# Patient Record
Sex: Male | Born: 1985 | Hispanic: Yes | Marital: Single | Smoking: Never smoker
Health system: Southern US, Community
[De-identification: ages and names within clinical notes are randomized; demographics above are authoritative.]

## PROBLEM LIST (undated history)

## (undated) DIAGNOSIS — N200 Calculus of kidney: Secondary | ICD-10-CM

## (undated) HISTORY — DX: Calculus of kidney: N20.0

## (undated) HISTORY — PX: OTHER SURGICAL HISTORY: SHX169

---

## 2014-02-16 ENCOUNTER — Encounter (HOSPITAL_COMMUNITY): Payer: Self-pay | Admitting: Emergency Medicine

## 2014-02-16 ENCOUNTER — Emergency Department (HOSPITAL_COMMUNITY)
Admission: EM | Admit: 2014-02-16 | Discharge: 2014-02-16 | Disposition: A | Discharge status: Home / Self Care | Payer: Self-pay | Attending: Emergency Medicine | Admitting: Emergency Medicine

## 2014-02-16 DIAGNOSIS — Z87448 Personal history of other diseases of urinary system: Secondary | ICD-10-CM | POA: Insufficient documentation

## 2014-02-16 DIAGNOSIS — B349 Viral infection, unspecified: Secondary | ICD-10-CM

## 2014-02-16 DIAGNOSIS — B9789 Other viral agents as the cause of diseases classified elsewhere: Secondary | ICD-10-CM | POA: Insufficient documentation

## 2014-02-16 DIAGNOSIS — R21 Rash and other nonspecific skin eruption: Secondary | ICD-10-CM | POA: Insufficient documentation

## 2014-02-16 LAB — URINALYSIS, ROUTINE W REFLEX MICROSCOPIC
BILIRUBIN URINE: NEGATIVE
Glucose, UA: NEGATIVE mg/dL
HGB URINE DIPSTICK: NEGATIVE
Ketones, ur: NEGATIVE mg/dL
Leukocytes, UA: NEGATIVE
NITRITE: NEGATIVE
Protein, ur: NEGATIVE mg/dL
Specific Gravity, Urine: 1.02 (ref 1.005–1.030)
UROBILINOGEN UA: 1 mg/dL (ref 0.0–1.0)
pH: 5.5 (ref 5.0–8.0)

## 2014-02-16 NOTE — ED Provider Notes (Signed)
CSN: [id number]     Arrival date & time 02/16/14  1032 History   First MD Initiated Contact with Patient 02/16/14 1054     Chief Complaint  Patient presents with  . Allergic Reaction     (Consider location/radiation/quality/duration/timing/severity/associated sxs/prior Treatment) Patient is a 28 y.o. male presenting with fever. The history is provided by the patient and the spouse. No language interpreter was used.  Fever Max temp prior to arrival:  103 Temp source:  Oral Duration:  2 days Associated symptoms: myalgias and rash   Associated symptoms: no chest pain, no congestion, no cough, no diarrhea, no dysuria, no nausea and no vomiting   Associated symptoms comment:  Yesterday he started having fever to Tmax 103, generalized body aches without other symptoms. No cough, N, V, dysuria or congestion. No sick family members. He took ibuprofen last night and experienced hives that resolved spontaneously. No SOB, oral swelling. Per his wife, who is acting as interpreter, he had a history of similar symptoms in the past associated with a kidney infection. He denies urinary symptoms currently.    No past medical history on file. History reviewed. No pertinent past surgical history. No family history on file. History  Substance Use Topics  . Smoking status: Never Smoker   . Smokeless tobacco: Not on file  . Alcohol Use: No    Review of Systems  Constitutional: Positive for fever.  HENT: Negative for congestion and trouble swallowing.   Respiratory: Negative for cough and shortness of breath.   Cardiovascular: Negative for chest pain.  Gastrointestinal: Negative for nausea, vomiting, abdominal pain and diarrhea.  Genitourinary: Negative for dysuria and hematuria.  Musculoskeletal: Positive for myalgias.  Skin: Positive for rash.      Allergies  Review of patient's allergies indicates no known allergies.  Home Medications   Current Outpatient Rx  Name  Route  Sig  Dispense   Refill  . ibuprofen (ADVIL,MOTRIN) 200 MG tablet   Oral   Take 400 mg by mouth every 6 (six) hours as needed for moderate pain.          BP 130/77  Pulse 79  Temp(Src) 98.3 F (36.8 C) (Oral)  Resp 16  SpO2 100% Physical Exam  Constitutional: He appears well-developed and well-nourished.  HENT:  Head: Normocephalic.  Neck: Normal range of motion. Neck supple.  Cardiovascular: Normal rate and regular rhythm.   Pulmonary/Chest: Effort normal and breath sounds normal.  Abdominal: Soft. Bowel sounds are normal. There is no tenderness. There is no rebound and no guarding.  Musculoskeletal: Normal range of motion.  Neurological: He is alert. No cranial nerve deficit.  Skin: Skin is warm and dry. No rash noted.  Psychiatric: He has a normal mood and affect.    ED Course  Procedures (including critical care time) Labs Review Labs Reviewed  URINALYSIS, ROUTINE W REFLEX MICROSCOPIC   Imaging Review No results found.   EKG Interpretation None      MDM   Final diagnoses:  None    1. Febrile illness 2. Hives, resolved (thought secondary to ibuprofen)  He is well appearing without evidence of focal infection. Suspect viral process and will treat symptomatically. Instructed not to take ibuprofen in the future.     Arnoldo HookerShari A Ajai Harville, PA-C 02/16/14 1216

## 2014-02-16 NOTE — Discharge Instructions (Signed)
Cundo no se Cendant Corporationutilizan los antibiticos (Antibiotic Nonuse)  El mdico considera que la infeccin o problema que se ha presentado no puede solucionarse con antibiticos. La causa puede ser un virus o una bacteria. Slo el mdico podr determinar cul es la causa probable de la enfermedad. El resfro es la causa ms frecuente de infecciones tanto en adultos como en nios. La causa del resfro es un virus. El tratamiento con antibiticos no tendr Avon Productsefecto en una infeccin viral. Los virus son los responsables de la prdida de Rite Aidmuchos das de trabajo en la atencin de los nios enfermos, y tambin la prdida de [address]muchos das de clases. Los nios pueden contraer hasta 10 resfros o gripes por ao durante los cuales pueden presentar lagrimeo, sentirse molestos o incmodos. El objetivo del tratamiento en el caso de los virus es mantener confortable al enfermo. Los antibiticos son medicamentos que se utilizan para ayudar al organismo a Production managerluchar contra las infecciones bacterianas. Existen relativamente pocos tipos de bacterias que causan infecciones, pero hay cientos de virus. Aunque ambos ocasionan infecciones, son tipos de Holiday representativegrmenes muy diferentes. Una infeccin viral desaparecer por s Caremark Rxmisma dentro de los 7 a [address] das. Las infecciones bacterianas pueden contagiarse o empeorar si no se administra un tratamiento con antibiticos. Ejemplos de infecciones bacterianas son:  Anginas (como en las faringitis estreptoccicas o la amigdalitis).  Infecciones en el pulmn (neumona).  Infecciones en el odo y la piel. Ejemplos de infecciones virales son:  Resfros o gripe  La mayora de los casos de tos y bronquitis.  Anginas que no son causadas por el estreptococo.  Secrecin nasal. Lo mejor es no administrar antibiticos cuando una infeccin viral es la causa del problema. Los antibiticos pueden destruir las bacterias que son buenas para el organismo y se encuentran dentro del mismo y pueden hacer que las bacterias  dainas se desarrollen. Los antibiticos pueden tener efectos indeseables como La Croftalergias, nuseas y diarrea y no mejoran los sntomas de las infecciones virales. Adems, el uso repetido de antibiticos puede hacer que las bacterias que se encuentran dentro del organismo se vuelvan resistentes. Esa resistencia puede transmitirse a las bacterias dainas. La prxima vez que sufra una infeccin puede ser difcil tratarla si han utilizado antibiticos cuando no era necesario. Cuando no se utilizan antibiticos, el sistema inmunolgico se fortalece y combate las infecciones ms eficientemente. Tambin los antibiticos tendrn un mayor efecto cuando se prescriben en las infecciones bacterianas. En el caso de los nios, los tratamientos incluyen:  La administracin de lquidos extra Cardinal Healthdurante el da para hidratarlo.  Debe hacer reposo.  Slo adminstrele medicamentos de venta libre o los que le prescriba su mdico para Engineer, materialsaliviar el dolor, el malestar o la fiebre, segn las indicaciones.  El uso de un humidificador fro puede ser de utilidad cuando hay secrecin nasal.  Medicamentos para el resfro segn las indicaciones del mdico. El profesional que lo asiste podr prescribirle antibiticos si:  El problema que presenta hoy contina durante un tiempo mayor del esperado.  Sufre una infeccin bacteriana secundaria. SOLICITE ATENCIN MDICA SI:  La fiebre dura ms de 5 das.  Los sntomas no mejoran luego de 5 a [address] das, o Houstonempeoran.  Tiene dificultad para respirar.  Tiene sntomas de deshidratacin (bebe poco, no orina con frecuencia, la orina es de color oscuro).  Observa cambios en la conducta o siente ms cansancio (apata o letargo). Document Released: 12/03/2005 Document Revised: 02/25/2012 [facility] Patient Information 2014 [place] Estate[place], [place].  IT IS IMPORTANT NOT TO USE IBUPROFEN AGAIN  IN THE FUTURE. TAKE TYLENOL ONLY. Ronchas  (Hives)  Las ronchas son reas de la piel inflamadas (hinchadas)  rojas y que pican. Pueden cambiar de tamao y de ubicacin en el cuerpo. Las Armed forces operational officer y Geneticist, molecular durante algunas horas o das (ronchas agudas) o durante algunas semanas (ronchas crnicas). No pueden transmitirse de [place] persona a Theodoro Clock (no son contagiosas). Pueden empeorar al rascarse, hacer ejercicios y por estrs emocional.  CAUSAS   Reaccin alrgica a alimentos, aditivos o frmacos.  Infecciones, incluso el resfro comn.  Enfermedades, como la vasculitis, el lupus o la enfermedad tiroidea.  Exposicin al sol, al calor o al fro.  La prctica de ejercicios.  El estrs.  El contacto con algunas sustancias qumicas. SNTOMAS   Zonas hinchadas, rojas o blancas, sobre la piel. Las ronchas pueden cambiar de [place], forma, [place] y Armed forces logistics/support/administrative officer.  Picazn.  Hinchazn de las [employer] y [place]. Esto puede ocurrir si las ronchas se desarrollan en capas profundas de la piel. DIAGNSTICO  El mdico puede diagnosticar el problema haciendo un examen fsico. Conley Rolls indicar anlisis de sangre o un estudio de la piel para Production assistant, radio causa. En algunos casos, no puede determinarse la causa.  TRATAMIENTO  Los casos leves generalmente mejoran con medicamentos como los antihistamnicos. Los casos ms graves pueden requerir una inyeccin de epinefrina de Associate Professor. Si se conoce la causa de la urticaria, el tratamiento incluye evitar el factor desencadenante.  INSTRUCCIONES PARA EL CUIDADO EN EL HOGAR   Evite las causas que han desencadenado las ronchas.  Tome los antihistamnicos segn las indicaciones del mdico para reducir la gravedad de las ronchas. Generalmente se recomiendan los [employer] no son sedantes o con bajo efecto sedante. No conduzca vehculos mientras toma antihistamnicos.  Tome los medicamentos para la picazn exactamente como le indic el mdico.  Use ropas sueltas.  Cumpla con todas las visitas de control, segn le  indique su mdico. SOLICITE ATENCIN MDICA SI:   Siente una picazn intensa o persistente que no se calma con los medicamentos.  Le duelen las articulaciones o estn inflamadas. SOLICITE ATENCIN MDICA DE INMEDIATO SI:   Tiene fiebre.  Tiene la boca o los labios hinchados.  Tiene problemas para respirar o tragar.  Siente una opresin en la garganta o en el pecho.  Siente dolor abdominal. Estos problemas pueden ser los primeros signos de una reaccin alrgica que ponga en peligro la vida. Llame a los servicios de emergencia locales (911 en los [place]). ASEGRESE DE QUE:   Comprende estas instrucciones.  Controlar su enfermedad.  Solicitar ayuda de inmediato si no mejora o si empeora. Document Released: 12/03/2005 Document Revised: 06/03/2012 [facility] Patient Information 2014 [place], [place].

## 2014-02-16 NOTE — ED Notes (Signed)
Pt c/o lip swelling and rash since yesterday.  Denies any allergies that he knows about.  Airway patent.  Denies SOB.  Pt's wife states that pt had a fever of 103 yesterday.  States he has had a cold for about a week but has not started any new soaps or foods.

## 2014-02-16 NOTE — ED Provider Notes (Signed)
Medical screening examination/treatment/procedure(s) were performed by non-physician practitioner and as supervising physician I was immediately available for consultation/collaboration.   Korrey Schleicher T Goro Wenrick, MD 02/16/14 1542 

## 2020-01-29 ENCOUNTER — Emergency Department (HOSPITAL_COMMUNITY): Payer: Self-pay

## 2020-01-29 ENCOUNTER — Emergency Department (HOSPITAL_COMMUNITY)
Admission: EM | Admit: 2020-01-29 | Discharge: 2020-01-29 | Disposition: A | Discharge status: Home / Self Care | Payer: Self-pay | Attending: Emergency Medicine | Admitting: Emergency Medicine

## 2020-01-29 ENCOUNTER — Other Ambulatory Visit: Payer: Self-pay

## 2020-01-29 ENCOUNTER — Encounter (HOSPITAL_COMMUNITY): Payer: Self-pay | Admitting: Emergency Medicine

## 2020-01-29 DIAGNOSIS — N130 Hydronephrosis with ureteropelvic junction obstruction: Secondary | ICD-10-CM | POA: Insufficient documentation

## 2020-01-29 DIAGNOSIS — N23 Unspecified renal colic: Secondary | ICD-10-CM

## 2020-01-29 LAB — URINALYSIS, ROUTINE W REFLEX MICROSCOPIC
Bacteria, UA: NONE SEEN
Bilirubin Urine: NEGATIVE
Glucose, UA: NEGATIVE mg/dL
Ketones, ur: NEGATIVE mg/dL
Leukocytes,Ua: NEGATIVE
Nitrite: NEGATIVE
Protein, ur: NEGATIVE mg/dL
RBC / HPF: >50 RBC/hpf — ABNORMAL HIGH (ref 0–5)
Specific Gravity, Urine: 1.026 (ref 1.005–1.030)
pH: 6 (ref 5.0–8.0)

## 2020-01-29 LAB — BASIC METABOLIC PANEL
Anion gap: 10 (ref 5–15)
BUN: 10 mg/dL (ref 6–20)
CO2: 23 mmol/L (ref 22–32)
Calcium: 9.1 mg/dL (ref 8.9–10.3)
Chloride: 104 mmol/L (ref 98–111)
Creatinine, Ser: 0.89 mg/dL (ref 0.61–1.24)
GFR calc Af Amer: >60 mL/min (ref >60)
GFR calc non Af Amer: >60 mL/min (ref >60)
Glucose, Bld: 126 mg/dL — ABNORMAL HIGH (ref 70–99)
Potassium: 3.4 mmol/L — ABNORMAL LOW (ref 3.5–5.1)
Sodium: 137 mmol/L (ref 135–145)

## 2020-01-29 LAB — CBC WITH DIFFERENTIAL/PLATELET
Abs Immature Granulocytes: 0.03 10*3/uL (ref 0.00–0.07)
Basophils Absolute: 0.1 10*3/uL (ref 0.0–0.1)
Basophils Relative: 1 %
Eosinophils Absolute: 0.2 10*3/uL (ref 0.0–0.5)
Eosinophils Relative: 3 %
HCT: 46.5 % (ref 39.0–52.0)
Hemoglobin: 15 g/dL (ref 13.0–17.0)
Immature Granulocytes: 0 %
Lymphocytes Relative: 50 %
Lymphs Abs: 4.6 10*3/uL — ABNORMAL HIGH (ref 0.7–4.0)
MCH: 28.1 pg (ref 26.0–34.0)
MCHC: 32.3 g/dL (ref 30.0–36.0)
MCV: 87.1 fL (ref 80.0–100.0)
Monocytes Absolute: 1 10*3/uL (ref 0.1–1.0)
Monocytes Relative: 11 %
Neutro Abs: 3.2 10*3/uL (ref 1.7–7.7)
Neutrophils Relative %: 35 %
Platelets: 202 10*3/uL (ref 150–400)
RBC: 5.34 MIL/uL (ref 4.22–5.81)
RDW: 12.9 % (ref 11.5–15.5)
WBC: 9.1 10*3/uL (ref 4.0–10.5)
nRBC: 0 % (ref 0.0–0.2)

## 2020-01-29 MED ORDER — TAMSULOSIN HCL 0.4 MG PO CAPS: 0.4000 mg | ORAL_CAPSULE | Freq: Every day | ORAL | 0 refills | Status: DC

## 2020-01-29 MED ORDER — ONDANSETRON 8 MG PO TBDP: ORAL_TABLET | ORAL | 0 refills | Status: DC

## 2020-01-29 MED ORDER — FENTANYL CITRATE (PF) 100 MCG/2ML IJ SOLN
100.0000 ug | Freq: Once | INTRAMUSCULAR | Status: AC
  Administered 2020-01-29: 06:00:00 100 ug via INTRAVENOUS
  Filled 2020-01-29: qty 2

## 2020-01-29 MED ORDER — TAMSULOSIN HCL 0.4 MG PO CAPS
0.4000 mg | ORAL_CAPSULE | ORAL | Status: AC
  Administered 2020-01-29: 05:00:00 0.4 mg via ORAL
  Filled 2020-01-29: qty 1

## 2020-01-29 MED ORDER — FENTANYL CITRATE (PF) 100 MCG/2ML IJ SOLN
50.0000 ug | Freq: Once | INTRAMUSCULAR | Status: AC
  Administered 2020-01-29: 50 ug via INTRAVENOUS
  Filled 2020-01-29: qty 2

## 2020-01-29 MED ORDER — DICLOFENAC SODIUM ER 100 MG PO TB24: 100.0000 mg | ORAL_TABLET | Freq: Every day | ORAL | 0 refills | Status: DC

## 2020-01-29 MED ORDER — ONDANSETRON HCL 4 MG/2ML IJ SOLN
4.0000 mg | Freq: Once | INTRAMUSCULAR | Status: AC
  Administered 2020-01-29: 05:00:00 4 mg via INTRAVENOUS
  Filled 2020-01-29: qty 2

## 2020-01-29 MED ORDER — OXYCODONE-ACETAMINOPHEN 5-325 MG PO TABS: 1.0000 | ORAL_TABLET | Freq: Four times a day (QID) | ORAL | 0 refills | Status: DC | PRN

## 2020-01-29 MED ORDER — KETOROLAC TROMETHAMINE 15 MG/ML IJ SOLN
15.0000 mg | Freq: Once | INTRAMUSCULAR | Status: AC
  Administered 2020-01-29: 05:00:00 15 mg via INTRAVENOUS
  Filled 2020-01-29: qty 1

## 2020-01-29 NOTE — ED Triage Notes (Signed)
Patient reports left flank pain this evening , denies dysuria or hematuria , no injury or fall , no fever or chills .

## 2020-01-29 NOTE — ED Notes (Signed)
Patient transported to CT. Other medications to be given and IV initiated when the patient returns.

## 2020-01-29 NOTE — ED Provider Notes (Signed)
[facility] EMERGENCY DEPARTMENT Provider Note   CSN: [id number] Arrival date & time: 01/29/20  5784     History Chief Complaint  Patient presents with  . Flank Pain    Reginald Velasquez is a 34 y.o. male.  The history is provided by the patient.  Flank Pain This is a recurrent problem. The current episode started 1 to 2 hours ago (left flank). The problem occurs constantly. The problem has not changed since onset.Pertinent negatives include no chest pain, no headaches and no shortness of breath. Nothing aggravates the symptoms. Nothing relieves the symptoms. He has tried nothing for the symptoms. The treatment provided no relief.  Patient with h/o stones presents with same pain this evening and associated nausea.       History reviewed. No pertinent past medical history.  There are no problems to display for this patient.   History reviewed. No pertinent surgical history.     History reviewed. No pertinent family history.  Social History   Tobacco Use  . Smoking status: Never Smoker  . Smokeless tobacco: Never Used  Substance Use Topics  . Alcohol use: No  . Drug use: No    Home Medications Prior to Admission medications   Not on File    Allergies    Patient has no known allergies.  Review of Systems   Review of Systems  Constitutional: Negative for fever.  HENT: Negative for congestion.   Eyes: Negative for visual disturbance.  Respiratory: Negative for shortness of breath.   Cardiovascular: Negative for chest pain.  Gastrointestinal: Positive for nausea. Negative for vomiting.  Genitourinary: Positive for flank pain.  Musculoskeletal: Negative for arthralgias.  Skin: Negative for rash.  Neurological: Negative for headaches.  Psychiatric/Behavioral: Negative for agitation.  All other systems reviewed and are negative.   Physical Exam Updated Vital Signs BP (!) 146/88   Pulse 63   Temp 98.5 F (36.9 C) (Oral)   Resp 18    SpO2 98%   Physical Exam Vitals and nursing note reviewed.  Constitutional:      Appearance: He is normal weight. He is not diaphoretic.  HENT:     Head: Normocephalic and atraumatic.     Nose: Nose normal.  Eyes:     Conjunctiva/sclera: Conjunctivae normal.     Pupils: Pupils are equal, round, and reactive to light.  Cardiovascular:     Rate and Rhythm: Normal rate and regular rhythm.     Pulses: Normal pulses.     Heart sounds: Normal heart sounds.  Pulmonary:     Effort: Pulmonary effort is normal.     Breath sounds: Normal breath sounds.  Abdominal:     General: Abdomen is flat.     Tenderness: There is no abdominal tenderness. There is no guarding or rebound.  Musculoskeletal:        General: Normal range of motion.     Cervical back: Normal range of motion and neck supple.  Skin:    General: Skin is warm and dry.     Capillary Refill: Capillary refill takes less than 2 seconds.  Neurological:     General: No focal deficit present.     Mental Status: He is alert and oriented to person, place, and time.     Deep Tendon Reflexes: Reflexes normal.  Psychiatric:        Mood and Affect: Mood normal.        Behavior: Behavior normal.     ED Results / Procedures /  Treatments   Labs (all labs ordered are listed, but only abnormal results are displayed) Results for orders placed or performed during the hospital encounter of 01/29/20  Urinalysis, Routine w reflex microscopic  Result Value Ref Range   Color, Urine YELLOW YELLOW   APPearance HAZY (A) CLEAR   Specific Gravity, Urine 1.026 1.005 - 1.030   pH 6.0 5.0 - 8.0   Glucose, UA NEGATIVE NEGATIVE mg/dL   Hgb urine dipstick MODERATE (A) NEGATIVE   Bilirubin Urine NEGATIVE NEGATIVE   Ketones, ur NEGATIVE NEGATIVE mg/dL   Protein, ur NEGATIVE NEGATIVE mg/dL   Nitrite NEGATIVE NEGATIVE   Leukocytes,Ua NEGATIVE NEGATIVE   RBC / HPF >50 (H) 0 - 5 RBC/hpf   WBC, UA 0-5 0 - 5 WBC/hpf   Bacteria, UA NONE SEEN NONE SEEN    Squamous Epithelial / LPF 0-5 0 - 5   Mucus PRESENT   CBC with Differential  Result Value Ref Range   WBC 9.1 4.0 - 10.5 K/uL   RBC 5.34 4.22 - 5.81 MIL/uL   Hemoglobin 15.0 13.0 - 17.0 g/dL   HCT 46.5 39.0 - 52.0 %   MCV 87.1 80.0 - 100.0 fL   MCH 28.1 26.0 - 34.0 pg   MCHC 32.3 30.0 - 36.0 g/dL   RDW 12.9 11.5 - 15.5 %   Platelets 202 150 - 400 K/uL   nRBC 0.0 0.0 - 0.2 %   Neutrophils Relative % 35 %   Neutro Abs 3.2 1.7 - 7.7 K/uL   Lymphocytes Relative 50 %   Lymphs Abs 4.6 (H) 0.7 - 4.0 K/uL   Monocytes Relative 11 %   Monocytes Absolute 1.0 0.1 - 1.0 K/uL   Eosinophils Relative 3 %   Eosinophils Absolute 0.2 0.0 - 0.5 K/uL   Basophils Relative 1 %   Basophils Absolute 0.1 0.0 - 0.1 K/uL   Immature Granulocytes 0 %   Abs Immature Granulocytes 0.03 0.00 - 0.07 K/uL  Basic metabolic panel  Result Value Ref Range   Sodium 137 135 - 145 mmol/L   Potassium 3.4 (L) 3.5 - 5.1 mmol/L   Chloride 104 98 - 111 mmol/L   CO2 23 22 - 32 mmol/L   Glucose, Bld 126 (H) 70 - 99 mg/dL   BUN 10 6 - 20 mg/dL   Creatinine, Ser 0.89 0.61 - 1.24 mg/dL   Calcium 9.1 8.9 - 10.3 mg/dL   GFR calc non Af Amer >60 >60 mL/min   GFR calc Af Amer >60 >60 mL/min   Anion gap 10 5 - 15   CT Renal Stone Study  Result Date: 01/29/2020 CLINICAL DATA:  Left flank pain this morning EXAM: CT ABDOMEN AND PELVIS WITHOUT CONTRAST TECHNIQUE: Multidetector CT imaging of the abdomen and pelvis was performed following the standard protocol without IV contrast. COMPARISON:  None. FINDINGS: Lower chest:  No contributory findings. Hepatobiliary: Hepatic steatosis with central sparing.No evidence of biliary obstruction or stone. Pancreas: Unremarkable. Spleen: Unremarkable. Adrenals/Urinary Tract: Negative adrenals. 7 mm stone at the left UPJ with mild hydronephrosis. No additional urolithiasis. The bladder is decompressed. Stomach/Bowel:  No obstruction. No appendicitis. Vascular/Lymphatic: No acute vascular  abnormality. No mass or adenopathy. Reproductive:No pathologic findings. Other: No ascites or pneumoperitoneum. Musculoskeletal: No acute abnormalities. IMPRESSION: 1. 7 mm left UPJ calculus with mild hydronephrosis. 2. Hepatic steatosis. Electronically Signed   By: Monte Fantasia M.D.   On: 01/29/2020 05:35    None  Radiology CT Renal Stone Study  Result Date: 01/29/2020  CLINICAL DATA:  Left flank pain this morning EXAM: CT ABDOMEN AND PELVIS WITHOUT CONTRAST TECHNIQUE: Multidetector CT imaging of the abdomen and pelvis was performed following the standard protocol without IV contrast. COMPARISON:  None. FINDINGS: Lower chest:  No contributory findings. Hepatobiliary: Hepatic steatosis with central sparing.No evidence of biliary obstruction or stone. Pancreas: Unremarkable. Spleen: Unremarkable. Adrenals/Urinary Tract: Negative adrenals. 7 mm stone at the left UPJ with mild hydronephrosis. No additional urolithiasis. The bladder is decompressed. Stomach/Bowel:  No obstruction. No appendicitis. Vascular/Lymphatic: No acute vascular abnormality. No mass or adenopathy. Reproductive:No pathologic findings. Other: No ascites or pneumoperitoneum. Musculoskeletal: No acute abnormalities. IMPRESSION: 1. 7 mm left UPJ calculus with mild hydronephrosis. 2. Hepatic steatosis. Electronically Signed   By: Marnee Spring M.D.   On: 01/29/2020 05:35    Procedures Procedures (including critical care time)  Medications Ordered in ED Medications  ketorolac (TORADOL) 15 MG/ML injection 15 mg (15 mg Intravenous Given 01/29/20 0524)  ondansetron (ZOFRAN) injection 4 mg (4 mg Intravenous Given 01/29/20 0525)  fentaNYL (SUBLIMAZE) injection 50 mcg (50 mcg Intravenous Given 01/29/20 0525)  tamsulosin (FLOMAX) capsule 0.4 mg (0.4 mg Oral Given 01/29/20 4627)    ED Course  I have reviewed the triage vital signs and the nursing notes.  Pertinent labs & imaging results that were available during my care of the patient  were reviewed by me and considered in my medical decision making (see chart for details).    Pain markedly improved post medication. Is sleeping.  Strainer dispensed.  Strain all urine.  Contact Urology this morning to schedule a follow up for 1 week in the future.    Final Clinical Impression(s) / ED Diagnoses Return for weakness, numbness, changes in vision or speech, fevers >100.4 unrelieved by medication, shortness of breath, intractable vomiting, or diarrhea, abdominal pain, Inability to tolerate liquids or food, cough, altered mental status or any concerns. No signs of systemic illness or infection. The patient is nontoxic-appearing on exam and vital signs are within normal limits.   I have reviewed the triage vital signs and the nursing notes. Pertinent labs &imaging results that were available during my care of the patient were reviewed by me and considered in my medical decision making (see chart for details).  After history, exam, and medical workup I feel the patient has been appropriately medically screened and is safe for discharge home. Pertinent diagnoses were discussed with the patient. Patient was given return precautions   Momodou Consiglio, MD 01/29/20 0350

## 2020-02-02 ENCOUNTER — Encounter (HOSPITAL_COMMUNITY): Payer: Self-pay

## 2020-02-02 ENCOUNTER — Other Ambulatory Visit: Payer: Self-pay

## 2020-02-02 ENCOUNTER — Emergency Department (HOSPITAL_COMMUNITY)
Admission: EM | Admit: 2020-02-02 | Discharge: 2020-02-02 | Disposition: A | Discharge status: Home / Self Care | Payer: Self-pay | Attending: Emergency Medicine | Admitting: Emergency Medicine

## 2020-02-02 ENCOUNTER — Emergency Department (HOSPITAL_COMMUNITY): Payer: Self-pay

## 2020-02-02 ENCOUNTER — Encounter (HOSPITAL_COMMUNITY): Payer: Self-pay | Admitting: *Deleted

## 2020-02-02 ENCOUNTER — Emergency Department (HOSPITAL_COMMUNITY)
Admission: EM | Admit: 2020-02-02 | Discharge: 2020-02-03 | Disposition: A | Discharge status: Home / Self Care | Payer: Self-pay | Attending: Emergency Medicine | Admitting: Emergency Medicine

## 2020-02-02 DIAGNOSIS — N23 Unspecified renal colic: Secondary | ICD-10-CM | POA: Insufficient documentation

## 2020-02-02 DIAGNOSIS — N201 Calculus of ureter: Secondary | ICD-10-CM | POA: Insufficient documentation

## 2020-02-02 LAB — URINALYSIS, ROUTINE W REFLEX MICROSCOPIC
Bilirubin Urine: NEGATIVE
Glucose, UA: NEGATIVE mg/dL
Hgb urine dipstick: NEGATIVE
Ketones, ur: NEGATIVE mg/dL
Leukocytes,Ua: NEGATIVE
Nitrite: NEGATIVE
Protein, ur: NEGATIVE mg/dL
Specific Gravity, Urine: 1.014 (ref 1.005–1.030)
pH: 9 — ABNORMAL HIGH (ref 5.0–8.0)

## 2020-02-02 LAB — LIPASE, BLOOD: Lipase: 33 U/L (ref 11–51)

## 2020-02-02 LAB — COMPREHENSIVE METABOLIC PANEL
ALT: 38 U/L (ref 0–44)
AST: 25 U/L (ref 15–41)
Albumin: 3.8 g/dL (ref 3.5–5.0)
Alkaline Phosphatase: 63 U/L (ref 38–126)
Anion gap: 9 (ref 5–15)
BUN: 12 mg/dL (ref 6–20)
CO2: 26 mmol/L (ref 22–32)
Calcium: 9.1 mg/dL (ref 8.9–10.3)
Chloride: 102 mmol/L (ref 98–111)
Creatinine, Ser: 1.36 mg/dL — ABNORMAL HIGH (ref 0.61–1.24)
GFR calc Af Amer: >60 mL/min (ref >60)
GFR calc non Af Amer: >60 mL/min (ref >60)
Glucose, Bld: 97 mg/dL (ref 70–99)
Potassium: 4.4 mmol/L (ref 3.5–5.1)
Sodium: 137 mmol/L (ref 135–145)
Total Bilirubin: 1.8 mg/dL — ABNORMAL HIGH (ref 0.3–1.2)
Total Protein: 7.3 g/dL (ref 6.5–8.1)

## 2020-02-02 LAB — CBC WITH DIFFERENTIAL/PLATELET
Abs Immature Granulocytes: 0.03 10*3/uL (ref 0.00–0.07)
Basophils Absolute: 0 10*3/uL (ref 0.0–0.1)
Basophils Relative: 0 %
Eosinophils Absolute: 0.1 10*3/uL (ref 0.0–0.5)
Eosinophils Relative: 2 %
HCT: 44.9 % (ref 39.0–52.0)
Hemoglobin: 14.4 g/dL (ref 13.0–17.0)
Immature Granulocytes: 0 %
Lymphocytes Relative: 20 %
Lymphs Abs: 1.8 10*3/uL (ref 0.7–4.0)
MCH: 28.3 pg (ref 26.0–34.0)
MCHC: 32.1 g/dL (ref 30.0–36.0)
MCV: 88.4 fL (ref 80.0–100.0)
Monocytes Absolute: 1.2 10*3/uL — ABNORMAL HIGH (ref 0.1–1.0)
Monocytes Relative: 14 %
Neutro Abs: 5.6 10*3/uL (ref 1.7–7.7)
Neutrophils Relative %: 64 %
Platelets: 188 10*3/uL (ref 150–400)
RBC: 5.08 MIL/uL (ref 4.22–5.81)
RDW: 12.3 % (ref 11.5–15.5)
WBC: 8.7 10*3/uL (ref 4.0–10.5)
nRBC: 0 % (ref 0.0–0.2)

## 2020-02-02 MED ORDER — HYDROMORPHONE HCL 1 MG/ML IJ SOLN
1.0000 mg | Freq: Once | INTRAMUSCULAR | Status: AC
  Administered 2020-02-02: 16:00:00 1 mg via INTRAVENOUS
  Filled 2020-02-02: qty 1

## 2020-02-02 MED ORDER — MORPHINE SULFATE (PF) 4 MG/ML IV SOLN
4.0000 mg | Freq: Once | INTRAVENOUS | Status: AC
  Administered 2020-02-02: 13:00:00 4 mg via INTRAVENOUS
  Filled 2020-02-02: qty 1

## 2020-02-02 MED ORDER — HYDROMORPHONE HCL 2 MG PO TABS: 2.0000 mg | ORAL_TABLET | Freq: Four times a day (QID) | ORAL | 0 refills | Status: DC | PRN

## 2020-02-02 NOTE — ED Notes (Signed)
Pt transported to CT ?

## 2020-02-02 NOTE — Discharge Instructions (Addendum)
You have been diagnosed today with Kidney Stone.  At this time there does not appear to be the presence of an emergent medical condition, however there is always the potential for conditions to change. Please read and follow the below instructions.  Please return to the Emergency Department immediately for any new or worsening symptoms. Please be sure to follow up with your Primary Care Provider within one week regarding your visit today; please call their office to schedule an appointment even if you are feeling better for a follow-up visit. Stop taking the medication diclofenac immediately.  Additionally stop taking any and all NSAID medications.  Do not take ibuprofen, naproxen or any other medications labeled nonsteroidal anti-inflammatory. You may use the pain medication Dilaudid as prescribed for severe pain.  Do not take any other sedating medications or drink alcohol with Dilaudid as this will worsen side effects.  Do not drive or perform any dangerous activities while taking Dilaudid as it will make you drowsy. You will be scheduled for a lithotripsy of your kidney stone this coming Monday with [facility].  Please call the number to confirm your appointment.  Please drink plenty of water and get plenty of rest.  Get help right away if: You have a fever or chills. You get very bad pain. You get new pain in your belly (abdomen). You pass out (faint). You cannot pee. You have any new/concerning or worsening of symptoms  Please read the additional information packets attached to your discharge summary.  Do not take your medicine if  develop an itchy rash, swelling in your mouth or lips, or difficulty breathing; call 911 and seek immediate emergency medical attention if this occurs.  Note: Portions of this text may have been transcribed using voice recognition software. Every effort was made to ensure accuracy; however, inadvertent computerized transcription errors may still be  present. --- Below has been translated using Google translate.  Errors may be present.  Interpret with caution.  A continuacin se ha traducido con Microbiologist. Puede haber errores. Interprete con precaucin. --- Reginald Velasquez le han diagnosticado clculos renales.  En este momento no parece haber la presencia de una afeccin mdica emergente, sin embargo, siempre existe la posibilidad de que las afecciones [place]. [place] y [place] instrucciones a continuacin.  1. Regrese al Departamento de Emergencias de inmediato si tiene sntomas nuevos o que [employer]. 2. Asegrese de hacer un seguimiento con su Proveedor de atencin primaria dentro de una semana con respecto a su visita de hoy; por favor llame a su oficina para programar una cita incluso si se siente mejor para una visita de seguimiento. 3. Deje de tomar el diclofenaco de inmediato. Adems, deje de tomar todos los [employer]. No tome ibuprofeno, naproxeno ni ningn otro medicamento etiquetado como antiinflamatorio no esteroideo. 4. Puede usar el analgsico Dilaudid segn lo prescrito para Conservation officer, historic buildings intenso. No tome ningn otro medicamento sedante ni beba alcohol con Dilaudid, ya que esto [employer] secundarios. No conduzca ni realice actividades peligrosas mientras est tomando Dilaudid, ya que lo adormecer. 5. Se le programar una litotricia de su clculo renal el prximo lunes con [facility]. Llame al nmero para confirmar su cita. Beba mucha agua y descanse lo suficiente.  Obtenga ayuda de inmediato si: ? Tiene fiebre o escalofros. ? Tiene Network engineer. ? Tiene un nuevo dolor en el vientre (abdomen). ? Te desmayas (desmayo). ? No puedes orinar. ? Tiene sntomas nuevos / preocupantes o que empeoran  Lea los paquetes de informacin adicional adjuntos a su resumen de alta.  No tome su medicamento si desarrolla un sarpullido con picazn, hinchazn en su boca o labios, o dificultad para respirar; Llame al  911 y busque atencin mdica de emergencia inmediata si esto ocurre.  Nota: Es posible que algunas partes de este texto se hayan transcrito con un software de reconocimiento de voz. Se hizo todo lo posible para garantizar la precisin; sin embargo, an pueden estar presentes errores de transcripcin computarizados inadvertidos.

## 2020-02-02 NOTE — ED Triage Notes (Signed)
Pt from home, c/o lower back pain since last Friday; denies urinary symptoms; denies injury; states he was seen for same and given medication but is still having a lot of pain

## 2020-02-02 NOTE — ED Notes (Signed)
Patient verbalizes understanding of discharge instructions. Opportunity for questioning and answers were provided. Pt discharged from ED. 
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
## 2020-02-02 NOTE — ED Triage Notes (Signed)
Pt was diagnosed with a kidney stone to the left kidney earlier today and discharged with pain medications. Pain is uncontrolled and pt returns to the ER at this time. Denies hematuria at this time.

## 2020-02-03 LAB — I-STAT CHEM 8, ED
BUN: 14 mg/dL (ref 6–20)
Calcium, Ion: 1.14 mmol/L — ABNORMAL LOW (ref 1.15–1.40)
Chloride: 97 mmol/L — ABNORMAL LOW (ref 98–111)
Creatinine, Ser: 1.2 mg/dL (ref 0.61–1.24)
Glucose, Bld: 124 mg/dL — ABNORMAL HIGH (ref 70–99)
HCT: 41 % (ref 39.0–52.0)
Hemoglobin: 13.9 g/dL (ref 13.0–17.0)
Potassium: 3.7 mmol/L (ref 3.5–5.1)
Sodium: 136 mmol/L (ref 135–145)
TCO2: 27 mmol/L (ref 22–32)

## 2020-02-03 MED ORDER — ONDANSETRON HCL 4 MG/2ML IJ SOLN
4.0000 mg | Freq: Once | INTRAMUSCULAR | Status: AC
  Administered 2020-02-03: 4 mg via INTRAVENOUS
  Filled 2020-02-03: qty 2

## 2020-02-03 MED ORDER — SODIUM CHLORIDE 0.9 % IV BOLUS (SEPSIS)
1000.0000 mL | Freq: Once | INTRAVENOUS | Status: AC
  Administered 2020-02-03: 1000 mL via INTRAVENOUS

## 2020-02-03 MED ORDER — SODIUM CHLORIDE 0.9 % IV SOLN: 1000.0000 mL | INTRAVENOUS | Status: DC

## 2020-02-03 MED ORDER — KETOROLAC TROMETHAMINE 15 MG/ML IJ SOLN
7.5000 mg | Freq: Once | INTRAMUSCULAR | Status: AC
  Administered 2020-02-03: 7.5 mg via INTRAVENOUS
  Filled 2020-02-03: qty 1

## 2020-02-03 MED ORDER — KETOROLAC TROMETHAMINE 15 MG/ML IJ SOLN
15.0000 mg | Freq: Once | INTRAMUSCULAR | Status: AC
  Administered 2020-02-03: 01:00:00 15 mg via INTRAVENOUS
  Filled 2020-02-03: qty 1

## 2020-02-03 MED ORDER — IBUPROFEN 600 MG PO TABS: 600.0000 mg | ORAL_TABLET | Freq: Four times a day (QID) | ORAL | 0 refills | Status: DC | PRN
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
## 2020-02-04 ENCOUNTER — Observation Stay (HOSPITAL_COMMUNITY): Payer: Self-pay | Admitting: Anesthesiology

## 2020-02-04 ENCOUNTER — Encounter (HOSPITAL_COMMUNITY)
Admission: EM | Disposition: A | Discharge status: Home / Self Care | Payer: Self-pay | Source: Home / Self Care | Attending: Emergency Medicine

## 2020-02-04 ENCOUNTER — Observation Stay (HOSPITAL_COMMUNITY)
Admission: EM | Admit: 2020-02-04 | Discharge: 2020-02-05 | Disposition: A | Discharge status: Home / Self Care | Payer: Self-pay | Attending: Urology | Admitting: Urology

## 2020-02-04 ENCOUNTER — Other Ambulatory Visit: Payer: Self-pay

## 2020-02-04 ENCOUNTER — Encounter (HOSPITAL_COMMUNITY): Payer: Self-pay | Admitting: Emergency Medicine

## 2020-02-04 ENCOUNTER — Observation Stay (HOSPITAL_COMMUNITY): Payer: Self-pay

## 2020-02-04 DIAGNOSIS — Z6837 Body mass index (BMI) 37.0-37.9, adult: Secondary | ICD-10-CM | POA: Insufficient documentation

## 2020-02-04 DIAGNOSIS — E669 Obesity, unspecified: Secondary | ICD-10-CM | POA: Insufficient documentation

## 2020-02-04 DIAGNOSIS — Z20822 Contact with and (suspected) exposure to covid-19: Secondary | ICD-10-CM | POA: Insufficient documentation

## 2020-02-04 DIAGNOSIS — N132 Hydronephrosis with renal and ureteral calculous obstruction: Principal | ICD-10-CM | POA: Insufficient documentation

## 2020-02-04 DIAGNOSIS — Z79899 Other long term (current) drug therapy: Secondary | ICD-10-CM | POA: Insufficient documentation

## 2020-02-04 DIAGNOSIS — N200 Calculus of kidney: Secondary | ICD-10-CM

## 2020-02-04 DIAGNOSIS — K760 Fatty (change of) liver, not elsewhere classified: Secondary | ICD-10-CM | POA: Insufficient documentation

## 2020-02-04 HISTORY — PX: CYSTOSCOPY/URETEROSCOPY/HOLMIUM LASER/STENT PLACEMENT: SHX6546

## 2020-02-04 LAB — I-STAT CHEM 8, ED
BUN: 17 mg/dL (ref 6–20)
Calcium, Ion: 1.19 mmol/L (ref 1.15–1.40)
Chloride: 99 mmol/L (ref 98–111)
Creatinine, Ser: 1.5 mg/dL — ABNORMAL HIGH (ref 0.61–1.24)
Glucose, Bld: 100 mg/dL — ABNORMAL HIGH (ref 70–99)
HCT: 43 % (ref 39.0–52.0)
Hemoglobin: 14.6 g/dL (ref 13.0–17.0)
Potassium: 4.4 mmol/L (ref 3.5–5.1)
Sodium: 138 mmol/L (ref 135–145)
TCO2: 28 mmol/L (ref 22–32)

## 2020-02-04 LAB — CBC WITH DIFFERENTIAL/PLATELET
Abs Immature Granulocytes: 0.02 10*3/uL (ref 0.00–0.07)
Basophils Absolute: 0 10*3/uL (ref 0.0–0.1)
Basophils Relative: 1 %
Eosinophils Absolute: 0.2 10*3/uL (ref 0.0–0.5)
Eosinophils Relative: 3 %
HCT: 45.9 % (ref 39.0–52.0)
Hemoglobin: 14.5 g/dL (ref 13.0–17.0)
Immature Granulocytes: 0 %
Lymphocytes Relative: 32 %
Lymphs Abs: 2.7 10*3/uL (ref 0.7–4.0)
MCH: 27.9 pg (ref 26.0–34.0)
MCHC: 31.6 g/dL (ref 30.0–36.0)
MCV: 88.3 fL (ref 80.0–100.0)
Monocytes Absolute: 1 10*3/uL (ref 0.1–1.0)
Monocytes Relative: 12 %
Neutro Abs: 4.5 10*3/uL (ref 1.7–7.7)
Neutrophils Relative %: 52 %
Platelets: 213 10*3/uL (ref 150–400)
RBC: 5.2 MIL/uL (ref 4.22–5.81)
RDW: 12.3 % (ref 11.5–15.5)
WBC: 8.5 10*3/uL (ref 4.0–10.5)
nRBC: 0 % (ref 0.0–0.2)

## 2020-02-04 LAB — URINALYSIS, ROUTINE W REFLEX MICROSCOPIC
Bilirubin Urine: NEGATIVE
Glucose, UA: NEGATIVE mg/dL
Hgb urine dipstick: NEGATIVE
Ketones, ur: 5 mg/dL — AB
Leukocytes,Ua: NEGATIVE
Nitrite: NEGATIVE
Protein, ur: NEGATIVE mg/dL
Specific Gravity, Urine: 1.018 (ref 1.005–1.030)
pH: 6 (ref 5.0–8.0)

## 2020-02-04 LAB — RESPIRATORY PANEL BY RT PCR (FLU A&B, COVID)
Influenza A by PCR: NEGATIVE
Influenza B by PCR: NEGATIVE
SARS Coronavirus 2 by RT PCR: NEGATIVE

## 2020-02-04 SURGERY — CYSTOSCOPY/URETEROSCOPY/HOLMIUM LASER/STENT PLACEMENT
Anesthesia: General | Laterality: Left

## 2020-02-04 MED ORDER — OXYCODONE HCL 5 MG PO TABS: 5.0000 mg | ORAL_TABLET | Freq: Once | ORAL | Status: DC | PRN

## 2020-02-04 MED ORDER — LACTATED RINGERS IV SOLN: INTRAVENOUS | Status: DC

## 2020-02-04 MED ORDER — IOHEXOL 300 MG/ML  SOLN
INTRAMUSCULAR | Status: DC | PRN
  Administered 2020-02-04: 20 mL

## 2020-02-04 MED ORDER — SODIUM CHLORIDE 0.9 % IV BOLUS
500.0000 mL | Freq: Once | INTRAVENOUS | Status: AC
  Administered 2020-02-04: 06:00:00 500 mL via INTRAVENOUS

## 2020-02-04 MED ORDER — PROPOFOL 500 MG/50ML IV EMUL
INTRAVENOUS | Status: AC
  Filled 2020-02-04: qty 50

## 2020-02-04 MED ORDER — HYDROMORPHONE HCL 1 MG/ML IJ SOLN
0.5000 mg | INTRAMUSCULAR | Status: DC | PRN
  Administered 2020-02-04 – 2020-02-05 (×5): 1 mg via INTRAVENOUS
  Filled 2020-02-04 (×5): qty 1

## 2020-02-04 MED ORDER — CEFAZOLIN SODIUM-DEXTROSE 2-4 GM/100ML-% IV SOLN
INTRAVENOUS | Status: AC
  Filled 2020-02-04: qty 100

## 2020-02-04 MED ORDER — ONDANSETRON HCL 4 MG/2ML IJ SOLN
4.0000 mg | Freq: Once | INTRAMUSCULAR | Status: AC
  Administered 2020-02-04: 04:00:00 4 mg via INTRAVENOUS

## 2020-02-04 MED ORDER — CEFAZOLIN SODIUM-DEXTROSE 2-4 GM/100ML-% IV SOLN: 2.0000 g | INTRAVENOUS | Status: AC

## 2020-02-04 MED ORDER — DEXTROSE-NACL 5-0.45 % IV SOLN: INTRAVENOUS | Status: DC

## 2020-02-04 MED ORDER — ONDANSETRON HCL 4 MG/2ML IJ SOLN
INTRAMUSCULAR | Status: DC | PRN
  Administered 2020-02-04: 4 mg via INTRAVENOUS

## 2020-02-04 MED ORDER — OXYCODONE HCL 5 MG PO TABS
5.0000 mg | ORAL_TABLET | ORAL | Status: DC | PRN
  Administered 2020-02-04 – 2020-02-05 (×2): 5 mg via ORAL
  Filled 2020-02-04 (×2): qty 1

## 2020-02-04 MED ORDER — ONDANSETRON HCL 4 MG/2ML IJ SOLN
INTRAMUSCULAR | Status: AC
  Filled 2020-02-04: qty 2

## 2020-02-04 MED ORDER — FENTANYL CITRATE (PF) 100 MCG/2ML IJ SOLN
100.0000 ug | Freq: Once | INTRAMUSCULAR | Status: AC
  Administered 2020-02-04: 08:00:00 100 ug via INTRAVENOUS
  Filled 2020-02-04: qty 2

## 2020-02-04 MED ORDER — KETOROLAC TROMETHAMINE 30 MG/ML IJ SOLN
INTRAMUSCULAR | Status: AC
  Filled 2020-02-04: qty 1

## 2020-02-04 MED ORDER — OXYCODONE HCL 5 MG/5ML PO SOLN: 5.0000 mg | Freq: Once | ORAL | Status: DC | PRN

## 2020-02-04 MED ORDER — FENTANYL CITRATE (PF) 100 MCG/2ML IJ SOLN
INTRAMUSCULAR | Status: DC | PRN
  Administered 2020-02-04 (×2): 50 ug via INTRAVENOUS

## 2020-02-04 MED ORDER — PHENYLEPHRINE 40 MCG/ML (10ML) SYRINGE FOR IV PUSH (FOR BLOOD PRESSURE SUPPORT)
PREFILLED_SYRINGE | INTRAVENOUS | Status: DC | PRN
  Administered 2020-02-04: 50 ug via INTRAVENOUS

## 2020-02-04 MED ORDER — CEFAZOLIN (ANCEF) 1 G IV SOLR
2.0000 g | INTRAVENOUS | Status: DC
  Administered 2020-02-04: 13:00:00 2 g
  Filled 2020-02-04: qty 2

## 2020-02-04 MED ORDER — DEXAMETHASONE SODIUM PHOSPHATE 10 MG/ML IJ SOLN
INTRAMUSCULAR | Status: DC | PRN
  Administered 2020-02-04: 10 mg via INTRAVENOUS

## 2020-02-04 MED ORDER — FENTANYL CITRATE (PF) 100 MCG/2ML IJ SOLN
INTRAMUSCULAR | Status: AC
  Filled 2020-02-04: qty 2

## 2020-02-04 MED ORDER — MIDAZOLAM HCL 2 MG/2ML IJ SOLN
INTRAMUSCULAR | Status: AC
  Filled 2020-02-04: qty 2

## 2020-02-04 MED ORDER — PHENYLEPHRINE 40 MCG/ML (10ML) SYRINGE FOR IV PUSH (FOR BLOOD PRESSURE SUPPORT)
PREFILLED_SYRINGE | INTRAVENOUS | Status: AC
  Filled 2020-02-04: qty 10

## 2020-02-04 MED ORDER — PROMETHAZINE HCL 25 MG/ML IJ SOLN: 6.2500 mg | INTRAMUSCULAR | Status: DC | PRN

## 2020-02-04 MED ORDER — FENTANYL CITRATE (PF) 100 MCG/2ML IJ SOLN: 25.0000 ug | INTRAMUSCULAR | Status: DC | PRN

## 2020-02-04 MED ORDER — KETOROLAC TROMETHAMINE 30 MG/ML IJ SOLN
30.0000 mg | Freq: Once | INTRAMUSCULAR | Status: AC
  Administered 2020-02-04: 04:00:00 30 mg via INTRAVENOUS

## 2020-02-04 MED ORDER — FENTANYL CITRATE (PF) 100 MCG/2ML IJ SOLN
100.0000 ug | Freq: Once | INTRAMUSCULAR | Status: AC
  Administered 2020-02-04: 05:00:00 100 ug via INTRAVENOUS
  Filled 2020-02-04: qty 2

## 2020-02-04 MED ORDER — SODIUM CHLORIDE 0.9 % IV SOLN
1.5000 mg/kg | Freq: Once | INTRAVENOUS | Status: AC
  Administered 2020-02-04: 07:00:00 176 mg via INTRAVENOUS
  Filled 2020-02-04: qty 8.8

## 2020-02-04 MED ORDER — LIDOCAINE 2% (20 MG/ML) 5 ML SYRINGE
INTRAMUSCULAR | Status: AC
  Filled 2020-02-04: qty 5

## 2020-02-04 MED ORDER — LIDOCAINE 2% (20 MG/ML) 5 ML SYRINGE
INTRAMUSCULAR | Status: DC | PRN
  Administered 2020-02-04: 100 mg via INTRAVENOUS

## 2020-02-04 MED ORDER — DICYCLOMINE HCL 10 MG/ML IM SOLN
20.0000 mg | Freq: Once | INTRAMUSCULAR | Status: AC
  Administered 2020-02-04: 06:00:00 20 mg via INTRAMUSCULAR
  Filled 2020-02-04: qty 2

## 2020-02-04 MED ORDER — DEXAMETHASONE SODIUM PHOSPHATE 10 MG/ML IJ SOLN
INTRAMUSCULAR | Status: AC
  Filled 2020-02-04: qty 1

## 2020-02-04 MED ORDER — MIDAZOLAM HCL 5 MG/5ML IJ SOLN
INTRAMUSCULAR | Status: DC | PRN
  Administered 2020-02-04: 2 mg via INTRAVENOUS

## 2020-02-04 MED ORDER — PROPOFOL 10 MG/ML IV BOLUS
INTRAVENOUS | Status: DC | PRN
  Administered 2020-02-04: 200 mg via INTRAVENOUS

## 2020-02-04 MED ORDER — KETOROLAC TROMETHAMINE 30 MG/ML IJ SOLN
30.0000 mg | Freq: Three times a day (TID) | INTRAMUSCULAR | Status: DC
  Administered 2020-02-04 – 2020-02-05 (×3): 30 mg via INTRAVENOUS
  Filled 2020-02-04 (×3): qty 1

## 2020-02-04 MED ORDER — SUCCINYLCHOLINE CHLORIDE 200 MG/10ML IV SOSY
PREFILLED_SYRINGE | INTRAVENOUS | Status: AC
  Filled 2020-02-04: qty 10

## 2020-02-04 MED ORDER — EPHEDRINE 5 MG/ML INJ
INTRAVENOUS | Status: AC
  Filled 2020-02-04: qty 10

## 2020-02-04 MED ORDER — SODIUM CHLORIDE 0.9 % IR SOLN
Status: DC | PRN
  Administered 2020-02-04: 3000 mL

## 2020-02-04 SURGICAL SUPPLY — 23 items
BAG URO CATCHER STRL LF (MISCELLANEOUS) ×3 IMPLANT
BASKET LASER NITINOL 1.9FR (BASKET) ×3 IMPLANT
CATH INTERMIT  6FR 70CM (CATHETERS) ×3 IMPLANT
CLOTH BEACON ORANGE TIMEOUT ST (SAFETY) ×3 IMPLANT
EXTRACTOR STONE 1.7FRX115CM (UROLOGICAL SUPPLIES) IMPLANT
FIBER LASER FLEXIVA 1000 (UROLOGICAL SUPPLIES) IMPLANT
FIBER LASER FLEXIVA 365 (UROLOGICAL SUPPLIES) IMPLANT
FIBER LASER FLEXIVA 550 (UROLOGICAL SUPPLIES) IMPLANT
FIBER LASER TRAC TIP (UROLOGICAL SUPPLIES) IMPLANT
GLOVE BIOGEL M STRL SZ7.5 (GLOVE) ×3 IMPLANT
GOWN STRL REUS W/TWL LRG LVL3 (GOWN DISPOSABLE) ×3 IMPLANT
GUIDEWIRE ANG ZIPWIRE 038X150 (WIRE) ×3 IMPLANT
GUIDEWIRE STR DUAL SENSOR (WIRE) ×3 IMPLANT
KIT TURNOVER KIT A (KITS) IMPLANT
MANIFOLD NEPTUNE II (INSTRUMENTS) ×3 IMPLANT
PACK CYSTO (CUSTOM PROCEDURE TRAY) ×6 IMPLANT
SHEATH URETERAL 12FRX28CM (UROLOGICAL SUPPLIES) IMPLANT
SHEATH URETERAL 12FRX35CM (MISCELLANEOUS) ×3 IMPLANT
STENT POLARIS 5FRX26 (STENTS) ×3 IMPLANT
TUBE FEEDING 8FR 16IN STR KANG (MISCELLANEOUS) ×3 IMPLANT
TUBING CONNECTING 10 (TUBING) ×2 IMPLANT
TUBING CONNECTING 10' (TUBING) ×1
TUBING UROLOGY SET (TUBING) ×3 IMPLANT
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
## 2020-02-04 NOTE — Anesthesia Preprocedure Evaluation (Addendum)
Anesthesia Evaluation  Patient identified by MRN, date of birth, ID band Patient awake    Reviewed: Allergy & Precautions, NPO status , Patient's Chart, lab work & pertinent test results  History of Anesthesia Complications Negative for: history of anesthetic complications  Airway Mallampati: II  TM Distance: >3 FB Neck ROM: Full    Dental  (+) Dental Advisory Given, Teeth Intact   Pulmonary neg pulmonary ROS,    Pulmonary exam normal        Cardiovascular negative cardio ROS Normal cardiovascular exam     Neuro/Psych negative neurological ROS  negative psych ROS   GI/Hepatic negative GI ROS, Neg liver ROS,   Endo/Other   Obesity   Renal/GU Renal InsufficiencyRenal disease     Musculoskeletal negative musculoskeletal ROS (+)   Abdominal (+) + obese,   Peds  Hematology negative hematology ROS (+)   Anesthesia Other Findings Covid neg 02/04/20   Reproductive/Obstetrics                            Anesthesia Physical Anesthesia Plan  ASA: II  Anesthesia Plan: General   Post-op Pain Management:    Induction: Intravenous  PONV Risk Score and Plan: 2 and Treatment may vary due to age or medical condition and Ondansetron  Airway Management Planned: LMA  Additional Equipment: None  Intra-op Plan:   Post-operative Plan: Extubation in OR  Informed Consent: I have reviewed the patients History and Physical, chart, labs and discussed the procedure including the risks, benefits and alternatives for the proposed anesthesia with the patient or authorized representative who has indicated his/her understanding and acceptance.     Dental advisory given  Plan Discussed with: CRNA and Anesthesiologist  Anesthesia Plan Comments: (Interpreter used for consent)       Anesthesia Quick Evaluation

## 2020-02-04 NOTE — ED Notes (Signed)
Pt tolerating lidocaine drip well. vss no acute distress

## 2020-02-04 NOTE — ED Triage Notes (Signed)
Patient arrived with complaints of left sided flank pain due a known kidney stone scheduled to be removed on Monday. Last dose of ibuprofen at 11 pm today.

## 2020-02-04 NOTE — Progress Notes (Signed)
Received report from Weston RN at [phone].

## 2020-02-04 NOTE — Brief Op Note (Signed)
02/04/2020  1:36 PM  PATIENT:  Mariel Sleet Rios-Cahuich  34 y.o. male  PRE-OPERATIVE DIAGNOSIS:  LEFT URETERAL OBSTRUCTION  POST-OPERATIVE DIAGNOSIS:  LEFT URETERAL OBSTRUCTION  PROCEDURE:  Procedure(s): CYSTOSCOPY/URETEROSCOPY/HOLMIUM LASER/STENT PLACEMENT/ LEFT RETROGRADE (Left)  SURGEON:  Surgeon(s) and Role:    * Sebastian Ache, MD - Primary  PHYSICIAN ASSISTANT:   ASSISTANTS: none   ANESTHESIA:   general  EBL:  minimal   BLOOD ADMINISTERED:none  DRAINS: none   LOCAL MEDICATIONS USED:  NONE  SPECIMEN:  Source of Specimen:  left ureteral stone fragments  DISPOSITION OF SPECIMEN:  [facility] for compositional analysis  COUNTS:  YES  TOURNIQUET:  * No tourniquets in log *  DICTATION: .Other Dictation: Dictation Number [id number]  PLAN OF CARE: Discharge to home after PACU  PATIENT DISPOSITION:  PACU - hemodynamically stable.   Delay start of Pharmacological VTE agent (>24hrs) due to surgical blood loss or risk of bleeding: yes

## 2020-02-04 NOTE — ED Notes (Signed)
WILL TRANSPORT PT TO 6E 1614-1. AAOX4. PT IN NO APPARENT DISTRESS WITH MILD PAIN. THE OPPORTUNITY TO ASK QUESTIONS WAS PROVIDED.
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
## 2020-02-04 NOTE — ED Notes (Signed)
Lidocaine drip finished pt tolerated well.

## 2020-02-04 NOTE — Progress Notes (Signed)
Urology Progress Note   34 year old male with left UPJ stone causing intractable pain.  Has not shown any signs of infection.  Subjective: Pain this morning.  Would like intervention.  No nausea or vomiting.  No fevers at home.  Objective: Vital signs in last 24 hours: Temp:  [97.9 F (36.6 C)-98.3 F (36.8 C)] 97.9 F (36.6 C) (02/18 0829) Pulse Rate:  [51-67] 58 (02/18 0829) Resp:  [11-20] 20 (02/18 0829) BP: (120-140)/(66-87) 134/74 (02/18 0829) SpO2:  [96 %-99 %] 96 % (02/18 0829) Weight:  [117.9 kg] 117.9 kg (02/18 0747)  Intake/Output from previous day: No intake/output data recorded. Intake/Output this shift: Total I/O In: 500 [IV Piggyback:500] Out: -   Physical Exam:  General: Alert and oriented CV: RRR Lungs: Normal work of breathing Abdomen: Soft, appropriately tender.  GU: Left CVA tenderness Ext: NT, No erythema  Lab Results: Recent Labs    02/03/20 0128 02/04/20 0407 02/04/20 0442  HGB 13.9 14.5 14.6  HCT 41.0 45.9 43.0   BMET Recent Labs    02/02/20 1236 02/02/20 1236 02/03/20 0128 02/04/20 0442  NA 137   < > 136 138  K 4.4   < > 3.7 4.4  CL 102   < > 97* 99  CO2 26  --   --   --   GLUCOSE 97   < > 124* 100*  BUN 12   < > 14 17  CREATININE 1.36*   < > 1.20 1.50*  CALCIUM 9.1  --   --   --    < > = values in this interval not displayed.     Studies/Results: CT Renal Stone Study  Result Date: 02/02/2020 CLINICAL DATA:  Left-sided flank pain EXAM: CT ABDOMEN AND PELVIS WITHOUT CONTRAST TECHNIQUE: Multidetector CT imaging of the abdomen and pelvis was performed following the standard protocol without IV contrast. COMPARISON:  CT 01/29/2020 FINDINGS: Lower chest: No acute abnormality. Hepatobiliary: Hepatic steatosis. No calcified gallstone or biliary dilatation Pancreas: Unremarkable. No pancreatic ductal dilatation or surrounding inflammatory changes. Spleen: Normal in size without focal abnormality. Adrenals/Urinary Tract: Adrenal glands  are normal. Normal right kidney. Similar degree of mild left hydronephrosis, secondary to a 7 x 8 mm left UPJ stone. No significant distal migration. Bladder is normal Stomach/Bowel: Stomach is within normal limits. Appendix appears normal. No evidence of bowel wall thickening, distention, or inflammatory changes. Vascular/Lymphatic: No significant vascular findings are present. No enlarged abdominal or pelvic lymph nodes. Reproductive: Prostate is unremarkable. Other: No abdominal wall hernia or abnormality. No abdominopelvic ascites. Musculoskeletal: No acute or significant osseous findings. IMPRESSION: 1. No significant change in mild left hydronephrosis, secondary to a 7 x 8 mm left UPJ stone. Stone position does not appear significantly changed. 2. Hepatic steatosis Electronically Signed   By: Jasmine Pang M.D.   On: 02/02/2020 15:36    Assessment/Plan:  34 y.o. male with obstructing left ureteral stone and intractable symptoms.  We will plan for stent with possible ureteroscopic stone extraction.  I discussed with the patient the plan and he is on board to proceed with ureteroscopy and stent.  However he would like a translator to review the plan again prior to signing any paperwork.  This is reasonable.  We will speak to him with a translator in the preoperative area.  -Surgery posted for this afternoon.  left ureteral stent, possible ureteroscopic stone extraction with laser lithotripsy   Dispo: Floor   LOS: 0 days   Federico Flake 02/04/2020, 11:09 AM

## 2020-02-04 NOTE — Op Note (Signed)
Reginald Velasquez, Reginald Velasquez MEDICAL RECORD DG:[id number] ACCOUNT [id number] DATE OF BIRTH:[birth year] FACILITY: [facility] LOCATION: [facility] PHYSICIAN:Adylynn Hertenstein Berneice Heinrich, MD  OPERATIVE REPORT  DATE OF PROCEDURE:  02/04/2020  SURGEON:  Sebastian Ache, MD  PREOPERATIVE DIAGNOSIS:  Left ureteral stone refractory colic.  PROCEDURE: 1.  Cystoscopy, left retrograde pyelogram, interpretation. 2.  Left ureteroscopy with laser lithotripsy. 3.  Insertion of left ureteral stent, 5 x 26 Polaris, no tether.  ESTIMATED BLOOD LOSS:  Nil.  COMPLICATIONS:  None.  SPECIMENS:  Left ureteral stone fragments for composition analysis.  FINDINGS: 1.  Left proximal ureteral stone, quite impacted with significant mucosal edema. 2.  Complete resolution of all accessible stone fragments larger than one-third mm on the left side following laser lithotripsy and basket extraction. 3.  Successful placement of left ureteral stent, proximal end in renal pelvis, distal end in urinary bladder.  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  He presented to ER with colicky flank pain to have a left proximal ureteral stone approximately 8 mm.  At that point in time, his colic was fairly well  controlled and given favorable stone characteristics, he was counseled towards shockwave lithotripsy and this is tentatively scheduled.  His pain unfortunately became refractory to even very high-dose oral narcotics and he represented to the emergency  room yet again.  Options were discussed for further management, including medical therapy with a bridge to shockwave lithotripsy versus proceeding with intervention today with a ureteroscopy and he wished to proceed with the latter.  Informed consent was  obtained and  placed in the medical record.  DESCRIPTION OF PROCEDURE:  The patient being identified, the procedure being left ureteroscopic stimulation was confirmed,  procedure timeout was performed.  Intravenous antibiotics were administered.  General LMA anesthesia induced.  The patient was  placed into a low lithotomy position.  Sterile field was created, prepping and draping his penis, perineum and proximal thighs using iodine.  Cystourethroscopy was performed using a 21-French rigid cystoscope with offset lens.  The anterior and posterior  urethra were unremarkable.  Inspection of bladder revealed no diverticula, calcifications, papillary lesions.  The left ureteral orifice was cannulated with a 6-French renal catheter and left retrograde pyelogram was obtained.  Left retrograde pyelogram demonstrated a single left ureter, single system left kidney.  There was a filling defect in the proximal ureter consistent with a known stone.  There was mild hydronephrosis above this.  A 0.03 ZIPwire was advanced to lower  pole and set aside as a safety wire.  An 8-French feeding tube was placed in the urinary bladder for pressure release.  Semirigid ureteroscopy was performed of the distal two-thirds of the left ureter alongside a separate sensor working wire.  No mucosal  abnormalities were found.  The semirigid scope was exchanged for a 12/14, 35 cm ureteral access sheath to the level of the proximal ureter using continuous fluoroscopic guidance and flexible digital ureteroscopy was performed of the proximal left  ureter, systematic inspection of the kidney and all calices x3.  The proximal stone was repositioned into an upper pole calix and there was significant mucosal edema at the site of prior stone impaction.  This stone was much too large for simple  basketing.  Holmium laser energy was applied at 70 using settings of 0.2 joules and 20 Hz and approximately 50% of the stone volume was dusted.  The remaining 50% volume was fragmented into pieces approximately 1-2 mm, which were  then grasped with an  escape basket, removed and set aside for composition analysis.  Following  this, complete resolution of all accessible stone fragments larger than one-third mm, excellent hemostasis.  The access sheath was removed under continuous vision and again there  was significant mucosal edema noted at the site of prior stone impaction.  Given this and the patient's preoperative colic, which was quite impressive, it was felt that interval stenting with a nontethered stent would be warranted.  Following resolution  of his edema and a new 5 x 26 Polaris-type stent was placed with remaining safety wire using fluoroscopic guidance.  Good proximal and distal plane were noted and the procedure was terminated.  The patient tolerated the procedure well.  No immediate  perioperative complications.  The patient was taken to the postanesthesia care unit in stable condition with plan for return to med/surg floor, possible discharge later today versus tomorrow pending his pain control.  VN/NUANCE  D:02/04/2020 T:02/04/2020 JOB:[id number]

## 2020-02-04 NOTE — Transfer of Care (Addendum)
Immediate Anesthesia Transfer of Care Note  Patient: Reginald Velasquez  Procedure(s) Performed: CYSTOSCOPY/URETEROSCOPY/HOLMIUM LASER/STENT PLACEMENT/ LEFT RETROGRADE (Left )  Patient Location: PACU  Anesthesia Type:General  Level of Consciousness: drowsy, patient cooperative and responds to stimulation  Airway & Oxygen Therapy: Patient Spontanous Breathing and Patient connected to face mask oxygen  Post-op Assessment: Report given to RN and Post -op Vital signs reviewed and unstable, Anesthesiologist notified  Post vital signs: Reviewed and stable  Last Vitals:  Vitals Value Taken Time  BP 147/80   Temp 98.5   Pulse 74 02/04/20 1350  Resp 16 02/04/20 1350  SpO2 98 % 02/04/20 1350  Vitals shown include unvalidated device data.  Last Pain:  Vitals:   02/04/20 1219  TempSrc: Oral  PainSc:       Patients Stated Pain Goal: 3 (02/04/20 0825)  Complications: No apparent anesthesia complications

## 2020-02-04 NOTE — Anesthesia Postprocedure Evaluation (Signed)
Anesthesia Post Note  Patient: Reginald Velasquez  Procedure(s) Performed: CYSTOSCOPY/URETEROSCOPY/HOLMIUM LASER/STENT PLACEMENT/ LEFT RETROGRADE (Left )     Patient location during evaluation: PACU Anesthesia Type: General Level of consciousness: awake and alert and oriented Pain management: pain level controlled Vital Signs Assessment: post-procedure vital signs reviewed and stable Respiratory status: spontaneous breathing, nonlabored ventilation and respiratory function stable Cardiovascular status: blood pressure returned to baseline Postop Assessment: no apparent nausea or vomiting Anesthetic complications: no    Last Vitals:  Vitals:   02/04/20 1427 02/04/20 1442  BP: (!) 145/83 (!) 147/93  Pulse: 70 63  Resp: 13 18  Temp: 36.7 C 36.9 C  SpO2: 94% 92%    Last Pain:  Vitals:   02/04/20 1442  TempSrc: Oral  PainSc:                  Kaylyn Layer

## 2020-02-04 NOTE — Anesthesia Procedure Notes (Signed)
Procedure Name: LMA Insertion Date/Time: 02/04/2020 12:57 PM Performed by: Jorene Minors, CRNA Pre-anesthesia Checklist: Patient identified, Emergency Drugs available, Suction available and Patient being monitored Patient Re-evaluated:Patient Re-evaluated prior to induction Oxygen Delivery Method: Circle system utilized Preoxygenation: Pre-oxygenation with 100% oxygen Induction Type: IV induction Ventilation: Mask ventilation without difficulty LMA: LMA inserted LMA Size: 5.0 Tube type: Oral Number of attempts: 1 Placement Confirmation: ETT inserted through vocal cords under direct vision,  positive ETCO2 and breath sounds checked- equal and bilateral Tube secured with: Tape Dental Injury: Teeth and Oropharynx as per pre-operative assessment

## 2020-02-05 ENCOUNTER — Inpatient Hospital Stay (HOSPITAL_COMMUNITY): Admission: RE | Admit: 2020-02-05 | Payer: Self-pay | Source: Ambulatory Visit

## 2020-02-05 LAB — URINE CULTURE: Culture: NO GROWTH

## 2020-02-05 MED ORDER — HYDROMORPHONE HCL 2 MG PO TABS: 2.0000 mg | ORAL_TABLET | Freq: Four times a day (QID) | ORAL | 0 refills | Status: DC | PRN

## 2020-02-05 MED ORDER — KETOROLAC TROMETHAMINE 10 MG PO TABS: 10.0000 mg | ORAL_TABLET | Freq: Three times a day (TID) | ORAL | 0 refills | Status: DC | PRN

## 2020-02-05 MED ORDER — DOCUSATE SODIUM 100 MG PO CAPS: 100.0000 mg | ORAL_CAPSULE | Freq: Every day | ORAL | 2 refills | Status: AC | PRN

## 2020-02-05 NOTE — Discharge Summary (Signed)
[facility] Discharge Summary  Admit date: 02/04/2020  Discharge date and time: 02/05/20   Discharge to: Home  Discharge Service: Urology  Discharge Attending Physician:  Berneice Heinrich  Discharge  Diagnoses: Kidney stone  OR Procedures: Procedure(s): CYSTOSCOPY/URETEROSCOPY/HOLMIUM LASER/STENT PLACEMENT/ LEFT RETROGRADE 02/04/2020   Ancillary Procedures: None   Discharge Day Services: The patient was seen and examined by the Urology team  in the morningprior to discharge.  Vital signs and laboratory values were stable Discharge instructions were explained and all questions answered.  Subjective  No acute events overnight. Pain Controlled. No fever or chills.  Objective Patient Vitals for the past 8 hrs:  BP Temp Temp src Pulse Resp SpO2  02/05/20 0644 103/66 97.8 F (36.6 C) Oral 61 18 94 %  02/05/20 0030 - - - - - 95 %   No intake/output data recorded.  General Appearance:        No acute distress Lungs:                       Normal work of breathing on room air Heart:                                Regular rate and rhythm Abdomen:                         Soft, non-tender, non-distended Extremities:                      Warm and well perfused   Hospital Course:  34 year old male with left-sided kidney stone that was refractory to outpatient management.  He eventually presented multiple times for pain.  He underwent left ureteroscopic stone extraction with laser lithotripsy on 02/04/2020.  Stone was able to be removed and a Polaris stent was left in place, NO STRING.  He tolerated the procedure well and had a uneventful postoperative course.  He stayed as it was late in the day and he needed further time to recover.  By the morning of postoperative day 1 he was appropriate for discharge.  Voiding.  Pain controlled.  Explained that he will need his stent removed and that we will review his stone analysis at follow-up.  This will be requested for 2 weeks postop  Condition at  Discharge: Improved  Discharge Medications:  Allergies as of 02/05/2020   No Known Allergies     Medication List    STOP taking these medications   ondansetron 8 MG disintegrating tablet Commonly known as: Zofran ODT   tamsulosin 0.4 MG Caps capsule Commonly known as: Flomax     TAKE these medications   docusate sodium 100 MG capsule Commonly known as: Colace Take 1 capsule (100 mg total) by mouth daily as needed.   HYDROmorphone 2 MG tablet Commonly known as: Dilaudid Take 1 tablet (2 mg total) by mouth every 6 (six) hours as needed for severe pain. Post-operatively What changed: additional instructions   ibuprofen 600 MG tablet Commonly known as: ADVIL Take 1 tablet (600 mg total) by mouth every 6 (six) hours as needed.   ketorolac 10 MG tablet Commonly known as: TORADOL Take 1 tablet (10 mg total) by mouth every 8 (eight) hours as needed for moderate pain. Or stent discomfort post-operatively.

## 2020-02-05 NOTE — Progress Notes (Signed)
Reviewed discharge paperwork, medication regimen, follow up appointments , and information for post stent replacement. Answered all questions. Patient discharged by wheelchair and escorted by NT.

## 2020-02-05 NOTE — Progress Notes (Addendum)
Received call from [facility] pharmacy asking to confirm with doctor that Tramadol prescription should be written for 5 days instead of 7. Sent secure chat and reached out to doctors office. Verified that pharmacy will fill for 5 days if not able to get in contact with doctor. Provided office number to pharmacy.

## 2020-02-05 NOTE — Discharge Instructions (Signed)
1. You may see some blood in the urine and may have some burning with urination for 48-72 hours. You also may notice that you have to urinate more frequently or urgently after your procedure which is normal.  2. You should call should you develop an inability urinate, fever > 101, persistent nausea and vomiting that prevents you from eating or drinking to stay hydrated.  3. If you have a stent, you will likely urinate more frequently and urgently until the stent is removed and you may experience some discomfort/pain in the lower abdomen and flank especially when urinating. You may take pain medication prescribed to you if needed for pain. You may also intermittently have blood in the urine until the stent is removed. 4. If you have issues,  you should call the office [phone]) to notify us.

## 2020-02-06 ENCOUNTER — Other Ambulatory Visit (HOSPITAL_COMMUNITY): Payer: Self-pay

## 2020-02-08 ENCOUNTER — Encounter (HOSPITAL_BASED_OUTPATIENT_CLINIC_OR_DEPARTMENT_OTHER): Admission: RE | Payer: Self-pay | Source: Home / Self Care

## 2020-02-08 ENCOUNTER — Ambulatory Visit (HOSPITAL_BASED_OUTPATIENT_CLINIC_OR_DEPARTMENT_OTHER): Admission: RE | Admit: 2020-02-08 | Payer: Self-pay | Source: Home / Self Care | Admitting: Urology

## 2020-02-08 SURGERY — LITHOTRIPSY, ESWL
Anesthesia: LOCAL | Laterality: Left

## 2021-05-05 IMAGING — CT CT RENAL STONE PROTOCOL
2 of 4 series · 17 of 46 positions shown, 19 images · non-contrast
Comparison: None.

CLINICAL DATA: Left flank pain this morning

EXAM:
CT ABDOMEN AND PELVIS WITHOUT CONTRAST
TECHNIQUE: Multidetector CT imaging of the abdomen and pelvis was performed
following the standard protocol without IV contrast.

[Series 3: renal stone 5.0 · axial · 0.85mm/px · z∈[-927,-472]mm · 14 of 99 slices shown, 16 images]
[im 4/99  soft-tissue]
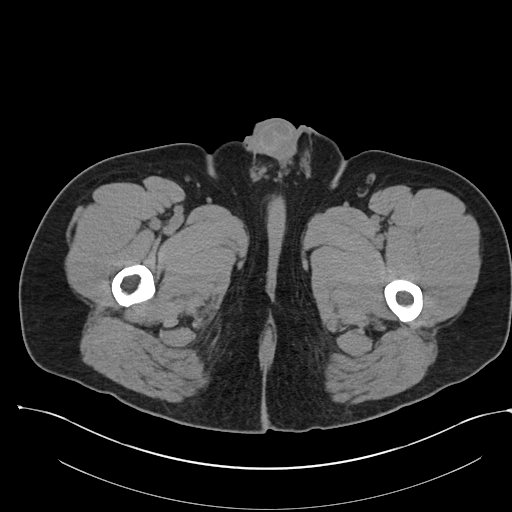
[im 4/99  bone]
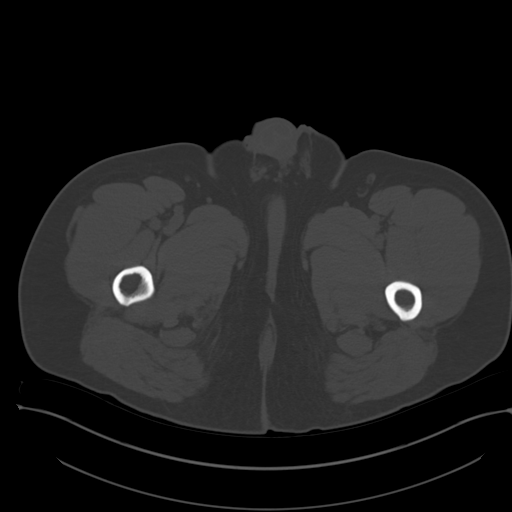
[im 12/99  soft-tissue]
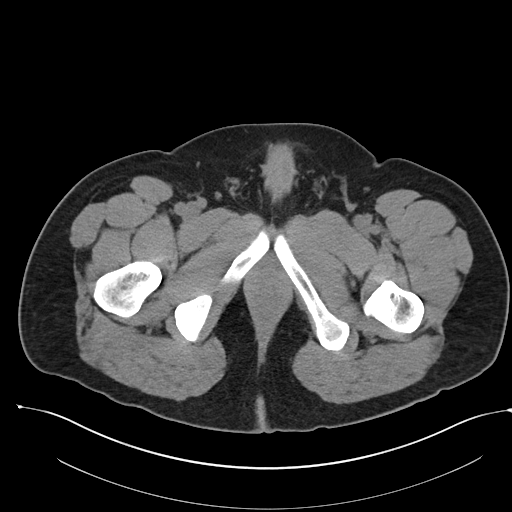
[im 19/99  soft-tissue]
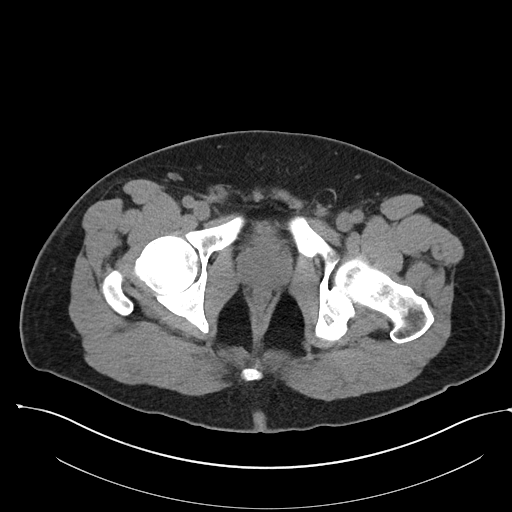
[im 27/99  soft-tissue]
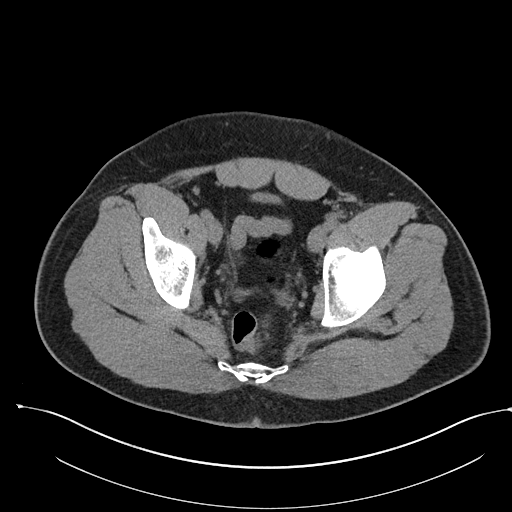
[im 34/99  soft-tissue]
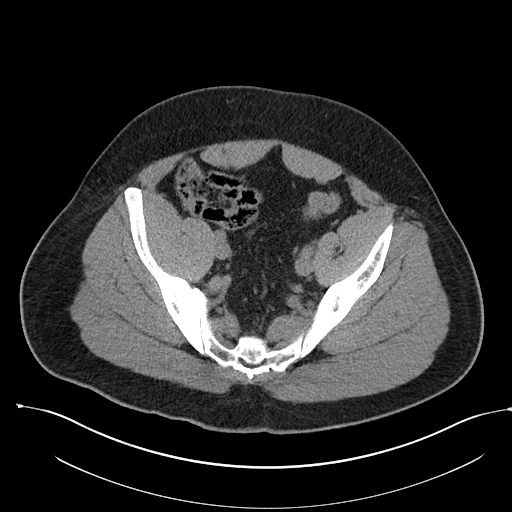
[im 38/99  soft-tissue]
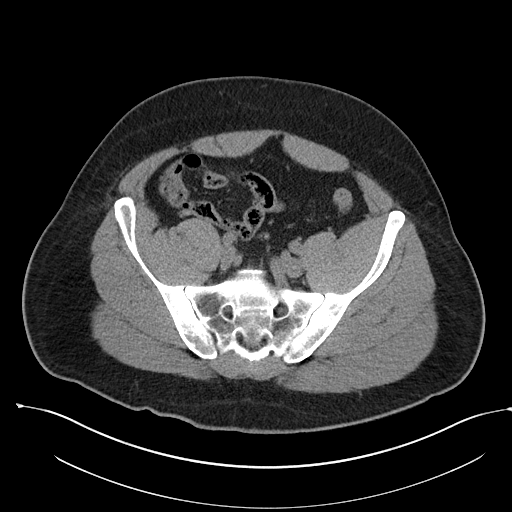
[im 46/99  soft-tissue]
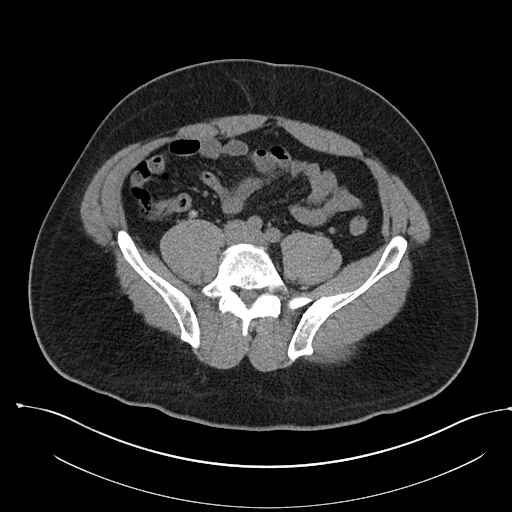
[im 53/99  soft-tissue]
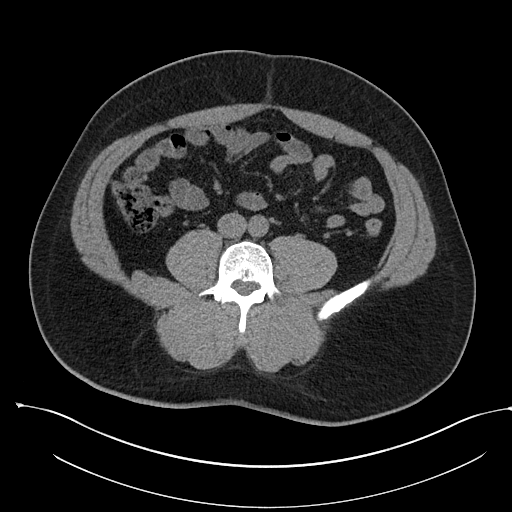
[im 61/99  soft-tissue]
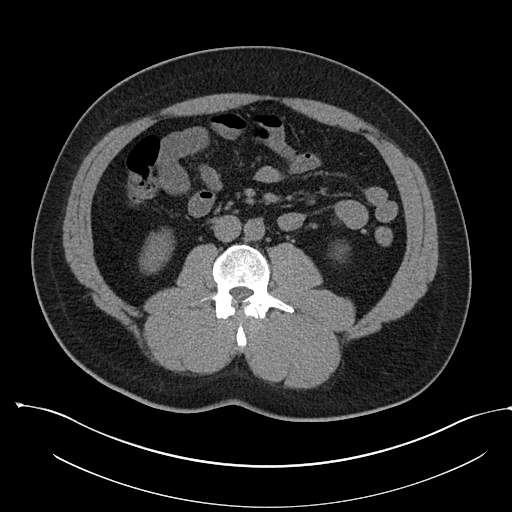
[im 61/99  bone]
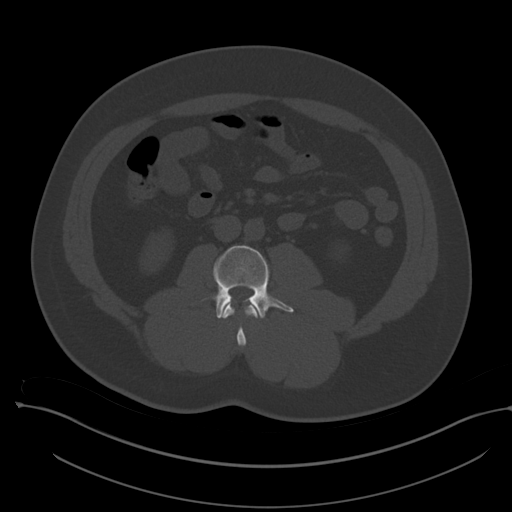
[im 65/99  soft-tissue]
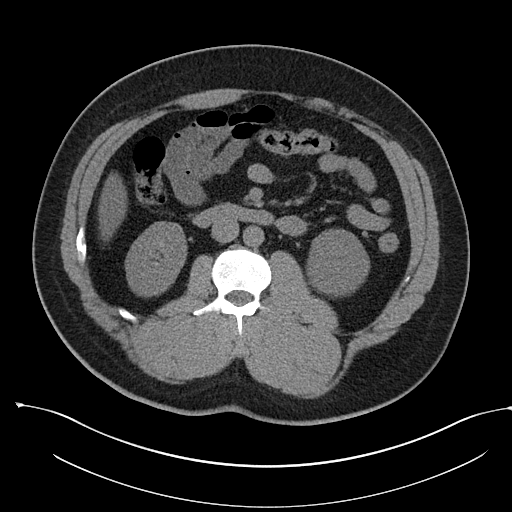
[im 72/99  soft-tissue]
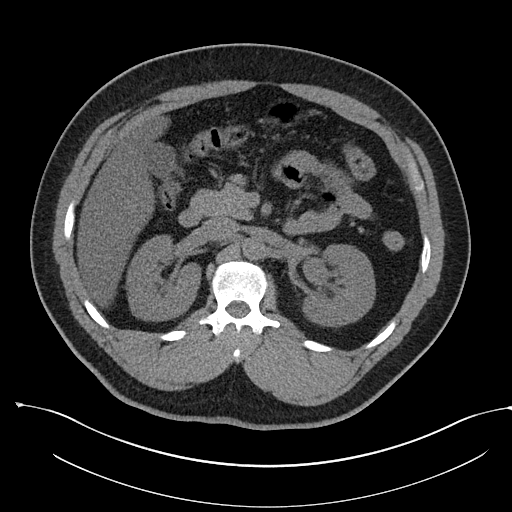
[im 80/99  soft-tissue]
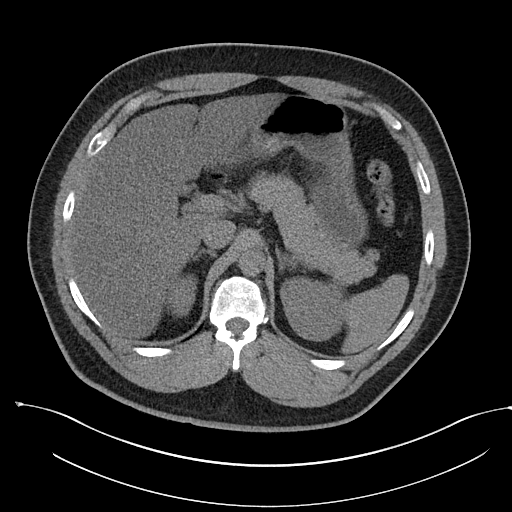
[im 87/99  soft-tissue]
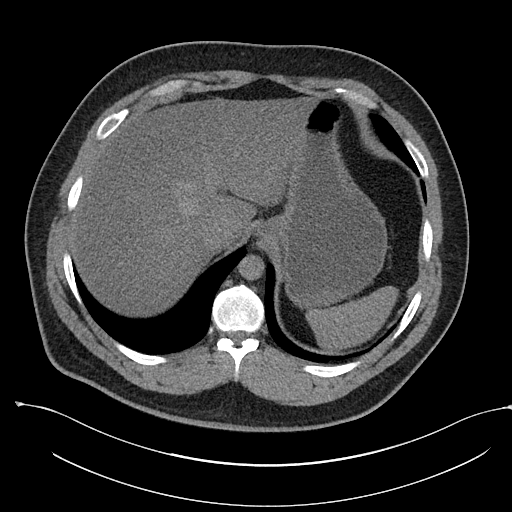
[im 95/99  soft-tissue]
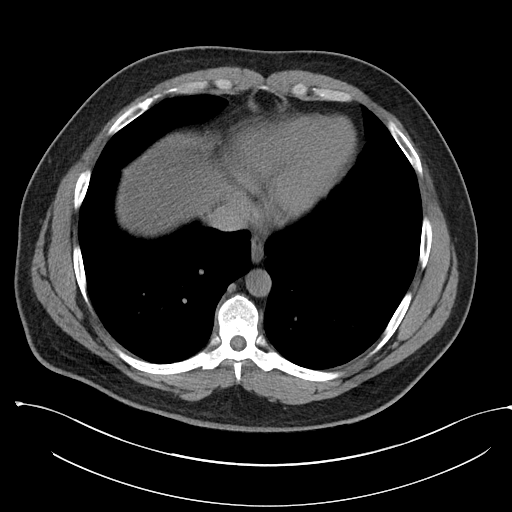

[Series 6: renal stone 3.0 cor · coronal · 0.97mm/px · 3 of 118 slices shown]
[im 40/118  soft-tissue]
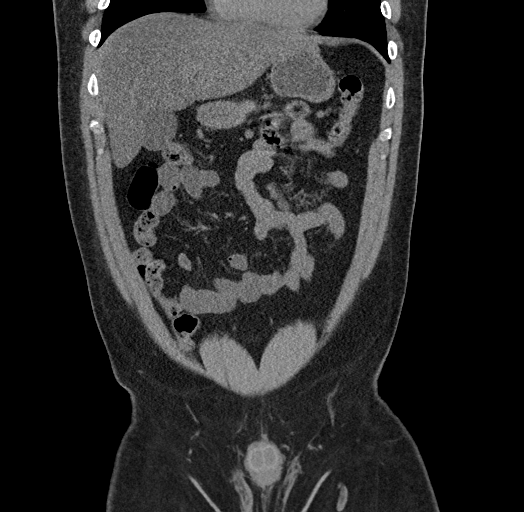
[im 53/118  soft-tissue]
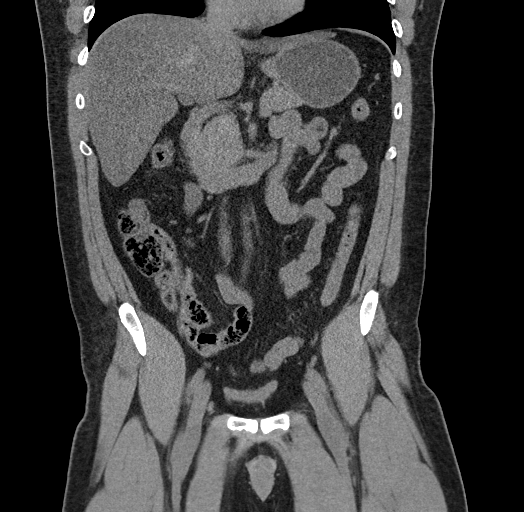
[im 66/118  soft-tissue]
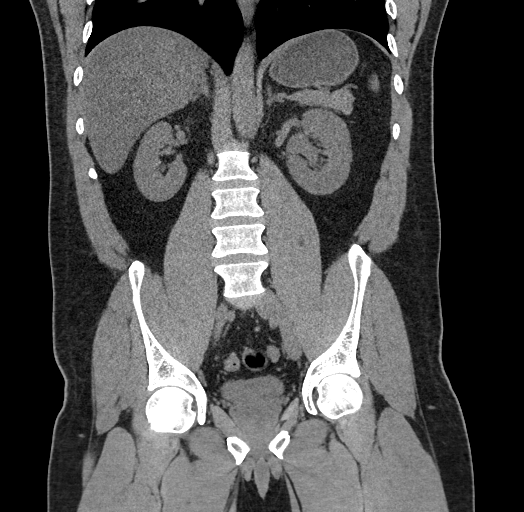

[17 of 46 positions shown; findings below may reference images not displayed]

FINDINGS: Lower chest:  No contributory findings.

Hepatobiliary: Hepatic steatosis with central sparing.No evidence of
biliary obstruction or stone.

Pancreas: Unremarkable.

Spleen: Unremarkable.

Adrenals/Urinary Tract: Negative adrenals. 7 mm stone at the left
UPJ with mild hydronephrosis. No additional urolithiasis. The
bladder is decompressed.

Stomach/Bowel:  No obstruction. No appendicitis.

Vascular/Lymphatic: No acute vascular abnormality. No mass or
adenopathy.

Reproductive:No pathologic findings.

Other: No ascites or pneumoperitoneum.

Musculoskeletal: No acute abnormalities.
IMPRESSION: 1. 7 mm left UPJ calculus with mild hydronephrosis.
2. Hepatic steatosis.

## 2022-08-20 ENCOUNTER — Encounter (HOSPITAL_COMMUNITY): Payer: Self-pay

## 2022-08-20 ENCOUNTER — Emergency Department (HOSPITAL_COMMUNITY)
Admission: EM | Admit: 2022-08-20 | Discharge: 2022-08-21 | Disposition: A | Discharge status: Home / Self Care | Payer: Self-pay | Attending: Emergency Medicine | Admitting: Emergency Medicine

## 2022-08-20 DIAGNOSIS — N201 Calculus of ureter: Secondary | ICD-10-CM

## 2022-08-20 DIAGNOSIS — N132 Hydronephrosis with renal and ureteral calculous obstruction: Secondary | ICD-10-CM | POA: Insufficient documentation

## 2022-08-20 HISTORY — DX: Calculus of kidney: N20.0

## 2022-08-20 MED ORDER — ONDANSETRON HCL 4 MG/2ML IJ SOLN
4.0000 mg | Freq: Once | INTRAMUSCULAR | Status: AC
  Administered 2022-08-21: 4 mg via INTRAVENOUS
  Filled 2022-08-20: qty 2

## 2022-08-20 MED ORDER — SODIUM CHLORIDE 0.9 % IV BOLUS
1000.0000 mL | Freq: Once | INTRAVENOUS | Status: AC
  Administered 2022-08-21: 1000 mL via INTRAVENOUS

## 2022-08-20 MED ORDER — MORPHINE SULFATE (PF) 4 MG/ML IV SOLN
4.0000 mg | Freq: Once | INTRAVENOUS | Status: AC
  Administered 2022-08-21: 4 mg via INTRAVENOUS
  Filled 2022-08-20: qty 1

## 2022-08-20 MED ORDER — KETOROLAC TROMETHAMINE 15 MG/ML IJ SOLN
15.0000 mg | Freq: Once | INTRAMUSCULAR | Status: AC
  Administered 2022-08-21: 15 mg via INTRAVENOUS
  Filled 2022-08-20: qty 1

## 2022-08-20 NOTE — ED Triage Notes (Signed)
Patient arrived with left sided flank pain that started about an hour ago. Hx of kidney stones in the past.

## 2022-08-20 NOTE — ED Provider Notes (Signed)
[facility]-EMERGENCY DEPT Provider Note   CSN: [id number] Arrival date & time: 08/20/22  2307     History  Chief Complaint  Patient presents with   Flank Pain   History provided by the patient with the assistance of Spanish interpreter [phone].  Reginald Velasquez is a 36 y.o. male who presents with his wife at the bedside with concern for left-sided flank pain that started around 10:00 this evening.  States that he has not been able to urinate since around 8:00 this evening and began with severe left-sided flank pain that rates to the lower abdomen.  History of kidney stones.  States that this feels similar.  No blood in his urine today, mild nausea but no vomiting.  No fevers or chills.  I personally read his medical records for edition above listed history is history of arm surgery.  No medications daily.  HPI     Home Medications Prior to Admission medications   Medication Sig Start Date End Date Taking? Authorizing Provider  oxyCODONE-acetaminophen (PERCOCET/ROXICET) 5-325 MG tablet Take 1 tablet by mouth every 6 (six) hours as needed for severe pain. 08/21/22  Yes Livan Hires, Eugene Gavia, PA-C  tamsulosin (FLOMAX) 0.4 MG CAPS capsule Take 1 capsule (0.4 mg total) by mouth daily. 08/21/22 09/20/22 Yes Lejuan Botto, Eugene Gavia, PA-C      Allergies    Patient has no known allergies.    Review of Systems   Review of Systems  Gastrointestinal:  Positive for nausea.  Genitourinary:  Positive for difficulty urinating and flank pain.    Physical Exam Updated Vital Signs BP 123/81   Pulse 60   Temp 98 F (36.7 C) (Oral)   Resp 17   SpO2 96%  Physical Exam Vitals and nursing note reviewed.  Constitutional:      General: He is in acute distress (Secondary to pain).     Appearance: He is obese. He is not ill-appearing.  HENT:     Head: Normocephalic and atraumatic.     Mouth/Throat:     Mouth: Mucous membranes are moist.     Pharynx: No oropharyngeal  exudate or posterior oropharyngeal erythema.  Eyes:     General:        Right eye: No discharge.        Left eye: No discharge.     Conjunctiva/sclera: Conjunctivae normal.  Cardiovascular:     Rate and Rhythm: Normal rate and regular rhythm.     Pulses: Normal pulses.     Heart sounds: Normal heart sounds. No murmur heard. Pulmonary:     Effort: Pulmonary effort is normal. No respiratory distress.     Breath sounds: Normal breath sounds. No wheezing or rales.  Abdominal:     General: Bowel sounds are normal. There is no distension.     Palpations: Abdomen is soft.     Tenderness: There is abdominal tenderness in the suprapubic area and left lower quadrant. There is left CVA tenderness. There is no right CVA tenderness, guarding or rebound.  Musculoskeletal:        General: No deformity.     Cervical back: Neck supple.  Skin:    General: Skin is warm and dry.     Capillary Refill: Capillary refill takes less than 2 seconds.  Neurological:     General: No focal deficit present.     Mental Status: He is alert and oriented to person, place, and time. Mental status is at baseline.  Psychiatric:  Mood and Affect: Mood normal.     ED Results / Procedures / Treatments   Labs (all labs ordered are listed, but only abnormal results are displayed) Labs Reviewed  COMPREHENSIVE METABOLIC PANEL - Abnormal; Notable for the following components:      Result Value   Glucose, Bld 132 (*)    AST 64 (*)    ALT 88 (*)    All other components within normal limits  URINALYSIS, ROUTINE W REFLEX MICROSCOPIC - Abnormal; Notable for the following components:   Hgb urine dipstick LARGE (*)    All other components within normal limits  CBC WITH DIFFERENTIAL/PLATELET    EKG None  Radiology CT Renal Stone Study  Result Date: 08/21/2022 CLINICAL DATA:  Flank pain.  Concern for kidney stone. EXAM: CT ABDOMEN AND PELVIS WITHOUT CONTRAST TECHNIQUE: Multidetector CT imaging of the abdomen and  pelvis was performed following the standard protocol without IV contrast. RADIATION DOSE REDUCTION: This exam was performed according to the departmental dose-optimization program which includes automated exposure control, adjustment of the mA and/or kV according to patient size and/or use of iterative reconstruction technique. COMPARISON:  CT abdomen pelvis dated 02/02/2020. FINDINGS: Evaluation of this exam is limited in the absence of intravenous contrast. Lower chest: The visualized lung bases are clear. No intra-abdominal free air or free fluid. Hepatobiliary: Fatty liver. No biliary dilatation. Similar appearance of high attenuating focus in the dome of the liver (14/2), likely representing portal venous blood against a low attenuating liver background. The gallbladder is unremarkable. Pancreas: Unremarkable. No pancreatic ductal dilatation or surrounding inflammatory changes. Spleen: Normal in size without focal abnormality. Adrenals/Urinary Tract: The adrenal glands unremarkable. There is an 11 mm stone in the proximal left ureter with mild left hydronephrosis. Additional nonobstructing bilateral renal calculi measure up to 6 mm in the lower pole of the right kidney. There is no hydronephrosis on the right. The right ureter and urinary bladder appear unremarkable. Stomach/Bowel: There is no bowel obstruction or active inflammation. The appendix is normal. Vascular/Lymphatic: The abdominal aorta and IVC unremarkable. No portal venous gas. There is no adenopathy. Reproductive: The prostate and seminal vesicles are grossly unremarkable. No pelvic mass. Other: None Musculoskeletal: No acute or significant osseous findings. IMPRESSION: 1. A 11 mm stone in the proximal left ureter with mild left hydronephrosis. Additional nonobstructing bilateral renal calculi. No hydronephrosis on the right. 2. Fatty liver. 3. No bowel obstruction. Normal appendix. Electronically Signed   By: Elgie Collard M.D.   On:  08/21/2022 01:38    Procedures Procedures    Medications Ordered in ED Medications  morphine (PF) 4 MG/ML injection 4 mg (4 mg Intravenous Given 08/21/22 0005)  ketorolac (TORADOL) 15 MG/ML injection 15 mg (15 mg Intravenous Given 08/21/22 0003)  ondansetron (ZOFRAN) injection 4 mg (4 mg Intravenous Given 08/21/22 0002)  sodium chloride 0.9 % bolus 1,000 mL (0 mLs Intravenous Stopped 08/21/22 0224)  HYDROmorphone (DILAUDID) injection 1 mg (1 mg Intravenous Given 08/21/22 0218)  sodium chloride 0.9 % bolus 500 mL (0 mLs Intravenous Stopped 08/21/22 0402)  HYDROmorphone (DILAUDID) injection 1 mg (1 mg Intravenous Given 08/21/22 0519)    ED Course/ Medical Decision Making/ A&P Clinical Course as of 08/21/22 0700  Tue Aug 21, 2022  0225 Bladder scan with 142cc, bladder not distended on CT. Will give additional bolus and allow PO intake to encourage urination.  [RS]  5573 Patient urinated. [RS]    Clinical Course User Index [RS] Cresta Riden, Eugene Gavia, PA-C  Medical Decision Making 36 year old male presents with concern for left-sided flank pain that started approximately 2 hours prior to arrival.  Hypertensive on intake vitals otherwise normal.  Cardiopulmonary exam is normal, abdominal exam with left lower quadrant tenderness palpation, suprapubic tenderness and left-sided CVAT.  Patient appears uncomfortable but is not ill-appearing.  The differential diagnosis of emergent flank pain includes, but is not limited to: Nephrolithiasis/ Renal Colic, Pyelonephritis, Abdominal aortic aneurysm, Aortic dissection, Renal artery embolism, Renal vein thrombosis, Renal infarction, Renal hemorrhage, Mesenteric ischemia, Bladder tumor, Cystitis, Biliary colic, Pancreatitis, Perforated peptic ulcer,  Appendicitis, Inguinal Hernia, Diverticulitis, Bowel obstruction. Shingles, Lower lobe pneumonia, Retroperitoneal hematoma/abscess/tumor, Epidural abscess, Epidural hematoma. In males, it is  important to consider (Testicular torsion, Epididymitis, STD)    Amount and/or Complexity of Data Reviewed Labs: ordered.    Details: CBC without leukocytosis or anemia.  CMP with transaminitis with AST/ALT 64/88.  UA with large hemoglobin otherwise unremarkable without evidence of infection.   Radiology: ordered.    Details: CT renal study with 11 mm proximal left ureteral stone with mild left hydronephrosis.  Visualized this provider.  Risk Prescription drug management.   Patient able to urinate in the emergency department, pain well controlled with IV analgesia.  Tolerating p.o. well.  Prefers to follow-up outpatient with his urologist.  Will provide follow-up information, tamsulosin, and oral analgesia in the outpatient setting.  Strict return precautions are given.  Kashon and his wife voiced understanding of her medical evaluation and treatment plan. Each of their questions answered to their expressed satisfaction.  Return precautions were given.  Patient is well-appearing, stable, and was discharged in good condition.  This chart was dictated using voice recognition software, Dragon. Despite the best efforts of this provider to proofread and correct errors, errors may still occur which can change documentation meaning.   Final Clinical Impression(s) / ED Diagnoses Final diagnoses:  Ureterolithiasis    Rx / DC Orders ED Discharge Orders          Ordered    oxyCODONE-acetaminophen (PERCOCET/ROXICET) 5-325 MG tablet  Every 6 hours PRN        08/21/22 0630    tamsulosin (FLOMAX) 0.4 MG CAPS capsule  Daily        08/21/22 0630              Juan Kissoon R, PA-C 08/21/22 0700    Sloan Leiter, DO 08/21/22 2311

## 2022-08-21 ENCOUNTER — Emergency Department (HOSPITAL_COMMUNITY): Payer: Self-pay

## 2022-08-21 ENCOUNTER — Encounter (HOSPITAL_COMMUNITY): Payer: Self-pay

## 2022-08-21 LAB — URINALYSIS, ROUTINE W REFLEX MICROSCOPIC
Bacteria, UA: NONE SEEN
Bilirubin Urine: NEGATIVE
Glucose, UA: NEGATIVE mg/dL
Ketones, ur: NEGATIVE mg/dL
Leukocytes,Ua: NEGATIVE
Nitrite: NEGATIVE
Protein, ur: NEGATIVE mg/dL
Specific Gravity, Urine: 1.024 (ref 1.005–1.030)
pH: 5 (ref 5.0–8.0)

## 2022-08-21 LAB — COMPREHENSIVE METABOLIC PANEL
ALT: 88 U/L — ABNORMAL HIGH (ref 0–44)
AST: 64 U/L — ABNORMAL HIGH (ref 15–41)
Albumin: 4.4 g/dL (ref 3.5–5.0)
Alkaline Phosphatase: 71 U/L (ref 38–126)
Anion gap: 7 (ref 5–15)
BUN: 12 mg/dL (ref 6–20)
CO2: 25 mmol/L (ref 22–32)
Calcium: 9.5 mg/dL (ref 8.9–10.3)
Chloride: 109 mmol/L (ref 98–111)
Creatinine, Ser: 1.19 mg/dL (ref 0.61–1.24)
GFR, Estimated: >60 mL/min (ref >60)
Glucose, Bld: 132 mg/dL — ABNORMAL HIGH (ref 70–99)
Potassium: 3.5 mmol/L (ref 3.5–5.1)
Sodium: 141 mmol/L (ref 135–145)
Total Bilirubin: 0.9 mg/dL (ref 0.3–1.2)
Total Protein: 8.1 g/dL (ref 6.5–8.1)

## 2022-08-21 LAB — CBC WITH DIFFERENTIAL/PLATELET
Abs Immature Granulocytes: 0.02 10*3/uL (ref 0.00–0.07)
Basophils Absolute: 0.1 10*3/uL (ref 0.0–0.1)
Basophils Relative: 1 %
Eosinophils Absolute: 0.2 10*3/uL (ref 0.0–0.5)
Eosinophils Relative: 3 %
HCT: 44.2 % (ref 39.0–52.0)
Hemoglobin: 14.2 g/dL (ref 13.0–17.0)
Immature Granulocytes: 0 %
Lymphocytes Relative: 40 %
Lymphs Abs: 3.7 10*3/uL (ref 0.7–4.0)
MCH: 28.1 pg (ref 26.0–34.0)
MCHC: 32.1 g/dL (ref 30.0–36.0)
MCV: 87.5 fL (ref 80.0–100.0)
Monocytes Absolute: 1 10*3/uL (ref 0.1–1.0)
Monocytes Relative: 11 %
Neutro Abs: 4.1 10*3/uL (ref 1.7–7.7)
Neutrophils Relative %: 45 %
Platelets: 187 10*3/uL (ref 150–400)
RBC: 5.05 MIL/uL (ref 4.22–5.81)
RDW: 13.1 % (ref 11.5–15.5)
WBC: 9.1 10*3/uL (ref 4.0–10.5)
nRBC: 0 % (ref 0.0–0.2)

## 2022-08-21 MED ORDER — HYDROMORPHONE HCL 2 MG/ML IJ SOLN
1.0000 mg | Freq: Once | INTRAMUSCULAR | Status: AC
  Administered 2022-08-21: 1 mg via INTRAVENOUS
  Filled 2022-08-21: qty 1

## 2022-08-21 MED ORDER — OXYCODONE-ACETAMINOPHEN 5-325 MG PO TABS: 1.0000 | ORAL_TABLET | Freq: Four times a day (QID) | ORAL | 0 refills | Status: DC | PRN

## 2022-08-21 MED ORDER — TAMSULOSIN HCL 0.4 MG PO CAPS: 0.4000 mg | ORAL_CAPSULE | Freq: Every day | ORAL | 0 refills | Status: AC

## 2022-08-21 MED ORDER — SODIUM CHLORIDE 0.9 % IV BOLUS
500.0000 mL | Freq: Once | INTRAVENOUS | Status: AC
  Administered 2022-08-21: 500 mL via INTRAVENOUS

## 2022-08-21 NOTE — Discharge Instructions (Signed)
You were seen in the ER today for your flank pain. You have another kidney stone. You may take the prescribed medications both for pain and to assist with passage of the stone.  Your stone is fairly large, 11 mm.  You may not be able to pass it on your own.  Please call Dr. Berneice Heinrich, urologist he saw previously to discuss intervention as needed.  His information is listed below.  Return to the ER with any worsening symptoms such as fever, chills, nausea or vomiting that does not stop, or any other new severe symptom.

## 2022-08-23 DIAGNOSIS — R1032 Left lower quadrant pain: Secondary | ICD-10-CM | POA: Insufficient documentation

## 2022-08-23 DIAGNOSIS — R110 Nausea: Secondary | ICD-10-CM | POA: Insufficient documentation

## 2022-08-24 ENCOUNTER — Emergency Department (HOSPITAL_COMMUNITY)
Admission: EM | Admit: 2022-08-24 | Discharge: 2022-08-24 | Disposition: A | Discharge status: Home / Self Care | Payer: Self-pay | Attending: Emergency Medicine | Admitting: Emergency Medicine

## 2022-08-24 ENCOUNTER — Other Ambulatory Visit: Payer: Self-pay

## 2022-08-24 ENCOUNTER — Encounter (HOSPITAL_COMMUNITY): Payer: Self-pay

## 2022-08-24 DIAGNOSIS — R109 Unspecified abdominal pain: Secondary | ICD-10-CM

## 2022-08-24 LAB — CBC WITH DIFFERENTIAL/PLATELET
Abs Immature Granulocytes: 0.03 10*3/uL (ref 0.00–0.07)
Basophils Absolute: 0 10*3/uL (ref 0.0–0.1)
Basophils Relative: 1 %
Eosinophils Absolute: 0.1 10*3/uL (ref 0.0–0.5)
Eosinophils Relative: 2 %
HCT: 41.3 % (ref 39.0–52.0)
Hemoglobin: 14 g/dL (ref 13.0–17.0)
Immature Granulocytes: 0 %
Lymphocytes Relative: 30 %
Lymphs Abs: 2.5 10*3/uL (ref 0.7–4.0)
MCH: 29 pg (ref 26.0–34.0)
MCHC: 33.9 g/dL (ref 30.0–36.0)
MCV: 85.7 fL (ref 80.0–100.0)
Monocytes Absolute: 0.7 10*3/uL (ref 0.1–1.0)
Monocytes Relative: 8 %
Neutro Abs: 4.9 10*3/uL (ref 1.7–7.7)
Neutrophils Relative %: 59 %
Platelets: 176 10*3/uL (ref 150–400)
RBC: 4.82 MIL/uL (ref 4.22–5.81)
RDW: 12.6 % (ref 11.5–15.5)
WBC: 8.3 10*3/uL (ref 4.0–10.5)
nRBC: 0 % (ref 0.0–0.2)

## 2022-08-24 LAB — BASIC METABOLIC PANEL
Anion gap: 8 (ref 5–15)
BUN: 15 mg/dL (ref 6–20)
CO2: 23 mmol/L (ref 22–32)
Calcium: 9 mg/dL (ref 8.9–10.3)
Chloride: 105 mmol/L (ref 98–111)
Creatinine, Ser: 0.97 mg/dL (ref 0.61–1.24)
GFR, Estimated: >60 mL/min (ref >60)
Glucose, Bld: 154 mg/dL — ABNORMAL HIGH (ref 70–99)
Potassium: 3.4 mmol/L — ABNORMAL LOW (ref 3.5–5.1)
Sodium: 136 mmol/L (ref 135–145)

## 2022-08-24 LAB — URINALYSIS, ROUTINE W REFLEX MICROSCOPIC
Bacteria, UA: NONE SEEN
Bilirubin Urine: NEGATIVE
Glucose, UA: NEGATIVE mg/dL
Ketones, ur: 5 mg/dL — AB
Leukocytes,Ua: NEGATIVE
Nitrite: NEGATIVE
Protein, ur: NEGATIVE mg/dL
Specific Gravity, Urine: 1.024 (ref 1.005–1.030)
pH: 5 (ref 5.0–8.0)

## 2022-08-24 MED ORDER — IBUPROFEN 800 MG PO TABS: 800.0000 mg | ORAL_TABLET | Freq: Three times a day (TID) | ORAL | 0 refills | Status: AC

## 2022-08-24 MED ORDER — KETOROLAC TROMETHAMINE 15 MG/ML IJ SOLN
15.0000 mg | Freq: Once | INTRAMUSCULAR | Status: AC
  Administered 2022-08-24: 15 mg via INTRAVENOUS
  Filled 2022-08-24: qty 1

## 2022-08-24 MED ORDER — HYDROMORPHONE HCL 1 MG/ML IJ SOLN
1.0000 mg | Freq: Once | INTRAMUSCULAR | Status: AC
  Administered 2022-08-24: 1 mg via INTRAVENOUS
  Filled 2022-08-24: qty 1

## 2022-08-24 NOTE — ED Triage Notes (Signed)
Patient is having left sided flank pain. Was prescribed oxycodone and flomax but said he cannot take the pain. Has not passed the stone.

## 2022-08-24 NOTE — ED Notes (Signed)
Pt given a list of local urologist within the [facility] area as requested per pt as he may call around to different providers as pt has no insurance to file. Pt verbalized understanding of discharge instructions, [employer] spanish interpreter used for communication.

## 2022-08-24 NOTE — ED Notes (Signed)
Pt resting with wife at the bedside, NAD noted, observed even RR and unlabored, plan of care ongoing, pt expresses no needs or concerns at this time, call light within reach, no further concerns as of present.

## 2022-08-24 NOTE — Discharge Instructions (Signed)
Tome tambin el ibuprofeno 3 veces al [employer]. Esto es para Chief Technology Officer. Puede tomar esto con Percocet y Flomax que ya tiene. Llame a otros consultorios de Personal assistant para ver si lo atendern si no puede expulsar el clculo por s solo. Fue Psychiatrist conocerte y espero que te sientas mejor.  (Take the ibuprofen 3 times a day as well.  This is for the pain.  You may take this with the Percocet and Flomax that you already have.  Call other urology offices to see if they will get you in if you are unable to pass the stone on her own.  It was a pleasure to meet you and I hope you feel better.)

## 2022-08-24 NOTE — ED Notes (Signed)
Verbal order from [facility] R to d/c bladder scan as pt was able to urinate on his own, 200 ml

## 2022-08-24 NOTE — ED Provider Notes (Signed)
[facility]-EMERGENCY DEPT Provider Note   CSN: [id number] Arrival date & time: 08/23/22  2353     History  Chief Complaint  Patient presents with   Flank Pain    Reginald Velasquez is a 36 y.o. male diagnosed with a kidney stone 4 days ago presenting due to pain.  He was given Percocet and Flomax which helped him for 1 day but the pain is worsening.  Becomes nauseated with the pain as well.  Says he has not been able to urinate since 10 PM, 6 hours ago.  Prior to that there was no dysuria or hematuria.  He elected for discharge a few days ago to see his own urologist however they would not give an appointment because he had an outstanding balance.    Flank Pain Pertinent negatives include no abdominal pain.       Home Medications Prior to Admission medications   Medication Sig Start Date End Date Taking? Authorizing Provider  oxyCODONE-acetaminophen (PERCOCET/ROXICET) 5-325 MG tablet Take 1 tablet by mouth every 6 (six) hours as needed for severe pain. 08/21/22   Sponseller, Eugene Gavia, PA-C  tamsulosin (FLOMAX) 0.4 MG CAPS capsule Take 1 capsule (0.4 mg total) by mouth daily. 08/21/22 09/20/22  Sponseller, Eugene Gavia, PA-C      Allergies    Patient has no known allergies.    Review of Systems   Review of Systems  Constitutional:  Negative for chills and fever.  Gastrointestinal:  Negative for abdominal pain, diarrhea, nausea and vomiting.  Genitourinary:  Positive for flank pain. Negative for dysuria, hematuria and penile pain.    Physical Exam Updated Vital Signs BP (!) 148/87   Pulse 60   Temp 98.1 F (36.7 C) (Oral)   Resp 12   SpO2 98%  Physical Exam Vitals and nursing note reviewed.  Constitutional:      Appearance: Normal appearance.  HENT:     Head: Normocephalic and atraumatic.  Eyes:     General: No scleral icterus.    Conjunctiva/sclera: Conjunctivae normal.  Pulmonary:     Effort: Pulmonary effort is normal. No respiratory  distress.  Abdominal:     General: Abdomen is flat.     Palpations: Abdomen is soft.     Tenderness: There is abdominal tenderness (Mild LLQ TTP). There is no right CVA tenderness or left CVA tenderness.  Skin:    Findings: No rash.  Neurological:     Mental Status: He is alert.  Psychiatric:        Mood and Affect: Mood normal.     ED Results / Procedures / Treatments   Labs (all labs ordered are listed, but only abnormal results are displayed) Labs Reviewed  URINALYSIS, ROUTINE W REFLEX MICROSCOPIC - Abnormal; Notable for the following components:      Result Value   Hgb urine dipstick SMALL (*)    Ketones, ur 5 (*)    All other components within normal limits  BASIC METABOLIC PANEL - Abnormal; Notable for the following components:   Potassium 3.4 (*)    Glucose, Bld 154 (*)    All other components within normal limits  CBC WITH DIFFERENTIAL/PLATELET    EKG None  Radiology No results found.  Procedures Procedures   Medications Ordered in ED Medications  ketorolac (TORADOL) 15 MG/ML injection 15 mg (15 mg Intravenous Given 08/24/22 0201)  HYDROmorphone (DILAUDID) injection 1 mg (1 mg Intravenous Given 08/24/22 0211)    ED Course/ Medical Decision Making/ A&P  Medical Decision Making Amount and/or Complexity of Data Reviewed Labs: ordered.  Risk Prescription drug management.   This is a 37 year old male with a recently diagnosed left-sided kidney stone presenting today due to pain.     Past Medical History / Co-morbidities / Social History: Diagnosed with 11 mm stone 4 days ago   Additional history: I viewed patient's visit and the fifth.  He had an 11 mm stone in the left proximal ureter.   Physical Exam: Pertinent physical exam findings include Mild left lower quadrant/flank TTP  Lab Tests: I ordered, and personally interpreted labs.  The pertinent results include: Normal white blood cell count UA without infection.  Some  hematuria, expected with the stone.    Medications: I ordered medication including Toradol and Dilaudid. Reevaluation of the patient after these medicines showed that the patient improved.    MDM/Disposition: This is a 36 year old male with a known left-sided kidney stone presenting today with pain.  Refractory to Percocet and Flomax.  Is unable to follow-up with urology due to his outstanding balance.  After being given Dilaudid and Toradol patient reports he does not have any pain.  He is hemodynamically stable.  No signs of sepsis, well-appearing and resting comfortably.  He is comfortable being discharged home with his current regimen as well as his ibuprofen.  He was not previously taking an NSAIDs and his kidney function is within normal limits without signs of AKI.  We discussed calling different urology offices and physicians to try and get established.  He also may pass the stone on his own but will continue to try and find follow-up outpatient.  He was discharged home in hemodynamically stable condition.  Entire encounter was performed with iPad [employer] interpreter.    I discussed this case with my attending physician Dr. Judd Lien who cosigned this note including patient's presenting symptoms, physical exam, and planned diagnostics and interventions. Attending physician stated agreement with plan or made changes to plan which were implemented.      Final Clinical Impression(s) / ED Diagnoses Final diagnoses:  Left flank pain    Rx / DC Orders ED Discharge Orders          Ordered    ibuprofen (ADVIL) 800 MG tablet  3 times daily        08/24/22 0532    Ambulatory referral to Urology        08/24/22 0533           Results and diagnoses were explained to the patient. Return precautions discussed in full. Patient had no additional questions and expressed complete understanding.   This chart was dictated using voice recognition software.  Despite best efforts to proofread,   errors can occur which can change the documentation meaning.    Saddie Benders, PA-C 08/24/22 7062    Geoffery Lyons, MD 08/24/22 [phone]

## 2022-08-27 ENCOUNTER — Telehealth: Payer: Self-pay

## 2022-08-27 ENCOUNTER — Other Ambulatory Visit: Payer: Self-pay

## 2022-08-27 DIAGNOSIS — N200 Calculus of kidney: Secondary | ICD-10-CM

## 2022-08-27 NOTE — Telephone Encounter (Signed)
Called and spoke with patient using language line # [phone]  Appointment scheduled with patient and location confirmed with xray as well.

## 2022-08-30 ENCOUNTER — Telehealth: Payer: Self-pay

## 2022-08-30 ENCOUNTER — Ambulatory Visit: Payer: Self-pay | Admitting: Physician Assistant

## 2022-08-30 NOTE — Telephone Encounter (Signed)
Called the language line to inform patient to get his KUB done before coming into appt today. Language line called patient with no answer and unable to leave voice message.

## 2022-11-11 ENCOUNTER — Emergency Department (HOSPITAL_COMMUNITY): Payer: Self-pay

## 2022-11-11 ENCOUNTER — Encounter (HOSPITAL_COMMUNITY): Payer: Self-pay

## 2022-11-11 ENCOUNTER — Other Ambulatory Visit: Payer: Self-pay

## 2022-11-11 ENCOUNTER — Emergency Department (HOSPITAL_COMMUNITY)
Admission: EM | Admit: 2022-11-11 | Discharge: 2022-11-12 | Disposition: A | Discharge status: Home / Self Care | Payer: Self-pay | Attending: Emergency Medicine | Admitting: Emergency Medicine

## 2022-11-11 DIAGNOSIS — N200 Calculus of kidney: Secondary | ICD-10-CM | POA: Insufficient documentation

## 2022-11-11 LAB — COMPREHENSIVE METABOLIC PANEL
ALT: 77 U/L — ABNORMAL HIGH (ref 0–44)
AST: 50 U/L — ABNORMAL HIGH (ref 15–41)
Albumin: 3.9 g/dL (ref 3.5–5.0)
Alkaline Phosphatase: 54 U/L (ref 38–126)
Anion gap: 13 (ref 5–15)
BUN: 15 mg/dL (ref 6–20)
CO2: 25 mmol/L (ref 22–32)
Calcium: 9.9 mg/dL (ref 8.9–10.3)
Chloride: 102 mmol/L (ref 98–111)
Creatinine, Ser: 1.21 mg/dL (ref 0.61–1.24)
GFR, Estimated: >60 mL/min (ref >60)
Glucose, Bld: 125 mg/dL — ABNORMAL HIGH (ref 70–99)
Potassium: 4 mmol/L (ref 3.5–5.1)
Sodium: 140 mmol/L (ref 135–145)
Total Bilirubin: 0.8 mg/dL (ref 0.3–1.2)
Total Protein: 7.9 g/dL (ref 6.5–8.1)

## 2022-11-11 LAB — URINALYSIS, ROUTINE W REFLEX MICROSCOPIC
Bilirubin Urine: NEGATIVE
Glucose, UA: NEGATIVE mg/dL
Hgb urine dipstick: NEGATIVE
Ketones, ur: NEGATIVE mg/dL
Leukocytes,Ua: NEGATIVE
Nitrite: NEGATIVE
Protein, ur: NEGATIVE mg/dL
Specific Gravity, Urine: 1.025 (ref 1.005–1.030)
pH: 5 (ref 5.0–8.0)

## 2022-11-11 LAB — CBC WITH DIFFERENTIAL/PLATELET
Abs Immature Granulocytes: 0.03 10*3/uL (ref 0.00–0.07)
Basophils Absolute: 0.1 10*3/uL (ref 0.0–0.1)
Basophils Relative: 1 %
Eosinophils Absolute: 0.2 10*3/uL (ref 0.0–0.5)
Eosinophils Relative: 3 %
HCT: 43.1 % (ref 39.0–52.0)
Hemoglobin: 13.9 g/dL (ref 13.0–17.0)
Immature Granulocytes: 0 %
Lymphocytes Relative: 34 %
Lymphs Abs: 3 10*3/uL (ref 0.7–4.0)
MCH: 28.3 pg (ref 26.0–34.0)
MCHC: 32.3 g/dL (ref 30.0–36.0)
MCV: 87.8 fL (ref 80.0–100.0)
Monocytes Absolute: 1.1 10*3/uL — ABNORMAL HIGH (ref 0.1–1.0)
Monocytes Relative: 12 %
Neutro Abs: 4.5 10*3/uL (ref 1.7–7.7)
Neutrophils Relative %: 50 %
Platelets: 216 10*3/uL (ref 150–400)
RBC: 4.91 MIL/uL (ref 4.22–5.81)
RDW: 12.6 % (ref 11.5–15.5)
WBC: 8.9 10*3/uL (ref 4.0–10.5)
nRBC: 0 % (ref 0.0–0.2)

## 2022-11-11 LAB — LIPASE, BLOOD: Lipase: 57 U/L — ABNORMAL HIGH (ref 11–51)

## 2022-11-11 MED ORDER — OXYCODONE-ACETAMINOPHEN 5-325 MG PO TABS
1.0000 | ORAL_TABLET | Freq: Once | ORAL | Status: AC
  Administered 2022-11-11: 1 via ORAL
  Filled 2022-11-11: qty 1

## 2022-11-11 NOTE — ED Provider Triage Note (Signed)
Emergency Medicine Provider Triage Evaluation Note  Reginald Velasquez , a 36 y.o. male  was evaluated in triage.  Pt complains of left flank pain. Hx of kidney stones. Feels similar. Associated difficulty urinating. Last 7 PM. No hematuria. No fever. No midline back pain  Review of Systems  Positive: Left flank pain, dysuria. Negative: Fever, midline pain  Physical Exam  BP (!) 139/98   Pulse 76   Temp 99 F (37.2 C) (Oral)   Resp 18   SpO2 97%  Gen:   Awake, no distress   Resp:  Normal effort  MSK:   Moves extremities without difficulty, no midline tenderness ABD:  Flank pain Other:   Medical Decision Making  Medically screening exam initiated at 10:14 PM.  Appropriate orders placed.  Gearald Rios-Cahuich was informed that the remainder of the evaluation will be completed by another provider, this initial triage assessment does not replace that evaluation, and the importance of remaining in the ED until their evaluation is complete.  Flank pain, dysuria   Lashondra Vaquerano A, PA-C 11/11/22 2214

## 2022-11-11 NOTE — ED Triage Notes (Signed)
Pt reports left flank pain and dysuria onset yesterday. Hx of kidney stones and states this feels similar.

## 2022-11-12 MED ORDER — ONDANSETRON HCL 4 MG/2ML IJ SOLN
4.0000 mg | Freq: Once | INTRAMUSCULAR | Status: AC
  Administered 2022-11-12: 4 mg via INTRAVENOUS
  Filled 2022-11-12: qty 2

## 2022-11-12 MED ORDER — TAMSULOSIN HCL 0.4 MG PO CAPS: 0.4000 mg | ORAL_CAPSULE | Freq: Every day | ORAL | 0 refills | Status: AC

## 2022-11-12 MED ORDER — OXYCODONE-ACETAMINOPHEN 5-325 MG PO TABS: 1.0000 | ORAL_TABLET | Freq: Four times a day (QID) | ORAL | 0 refills | Status: AC | PRN

## 2022-11-12 MED ORDER — MAGNESIUM SULFATE 2 GM/50ML IV SOLN
2.0000 g | Freq: Once | INTRAVENOUS | Status: AC
  Administered 2022-11-12: 2 g via INTRAVENOUS
  Filled 2022-11-12: qty 50

## 2022-11-12 MED ORDER — ONDANSETRON HCL 4 MG PO TABS: 4.0000 mg | ORAL_TABLET | Freq: Four times a day (QID) | ORAL | 0 refills | Status: AC

## 2022-11-12 MED ORDER — KETOROLAC TROMETHAMINE 15 MG/ML IJ SOLN
15.0000 mg | Freq: Once | INTRAMUSCULAR | Status: AC
  Administered 2022-11-12: 15 mg via INTRAVENOUS
  Filled 2022-11-12: qty 1

## 2022-11-12 NOTE — Discharge Instructions (Addendum)
You have a 38mm kidney stone on the left side.  Please follow up with urologist for further care if you notice no improvement of your symptoms after 3-5 days.  Take medications prescribed.  Also, CT scan shows a cyst in your liver.  Follow up with your doctor and request for an MRI if your abdomen to ensure the cyst is not cancer.

## 2022-11-12 NOTE — ED Provider Notes (Signed)
[facility] EMERGENCY DEPARTMENT Provider Note   CSN: [id number] Arrival date & time: 11/11/22  2202     History  Chief Complaint  Patient presents with   Flank Pain    Reginald Velasquez is a 36 y.o. male.  The history is provided by the patient and medical records. No language interpreter was used.  Flank Pain     Patient is a 36 year old male with history of kidney stones presenting to ED with CC left flank pain. Pain began yesterday evening and has been consistent. Pain is 10/10 on pain scale. Pain does not radiate. No alleviating/aggravating factors noted. Patient reports minor dysuria, nausea, and one episode of emesis this morning. Patient denies hematuria, hematemesis, stool changes, urine changes, abdominal pain, chest pain, fever, malaise.  Home Medications Prior to Admission medications   Medication Sig Start Date End Date Taking? Authorizing Provider  ibuprofen (ADVIL) 800 MG tablet Take 1 tablet (800 mg total) by mouth 3 (three) times daily. 08/24/22   Redwine, Madison A, PA-C  oxyCODONE-acetaminophen (PERCOCET/ROXICET) 5-325 MG tablet Take 1 tablet by mouth every 6 (six) hours as needed for severe pain. 08/21/22   Sponseller, Eugene Gavia, PA-C      Allergies    Patient has no known allergies.    Review of Systems   Review of Systems  Genitourinary:  Positive for flank pain.  All other systems reviewed and are negative.   Physical Exam Updated Vital Signs BP 131/81 (BP Location: Right Arm)   Pulse 71   Temp 98.4 F (36.9 C) (Oral)   Resp 16   Ht 5\' 10"  (1.778 m)   Wt 117.5 kg   SpO2 100%   BMI 37.16 kg/m  Physical Exam Vitals and nursing note reviewed.  Constitutional:      General: He is not in acute distress.    Appearance: He is well-developed.  HENT:     Head: Atraumatic.  Eyes:     Conjunctiva/sclera: Conjunctivae normal.  Cardiovascular:     Rate and Rhythm: Normal rate and regular rhythm.     Pulses: Normal pulses.      Heart sounds: Normal heart sounds.  Pulmonary:     Effort: Pulmonary effort is normal.     Breath sounds: Normal breath sounds.  Abdominal:     Palpations: Abdomen is soft.     Tenderness: There is no abdominal tenderness. There is left CVA tenderness. There is no right CVA tenderness.  Musculoskeletal:     Cervical back: Neck supple.  Skin:    Findings: No rash.  Neurological:     Mental Status: He is alert.     ED Results / Procedures / Treatments   Labs (all labs ordered are listed, but only abnormal results are displayed) Labs Reviewed  CBC WITH DIFFERENTIAL/PLATELET - Abnormal; Notable for the following components:      Result Value   Monocytes Absolute 1.1 (*)    All other components within normal limits  COMPREHENSIVE METABOLIC PANEL - Abnormal; Notable for the following components:   Glucose, Bld 125 (*)    AST 50 (*)    ALT 77 (*)    All other components within normal limits  LIPASE, BLOOD - Abnormal; Notable for the following components:   Lipase 57 (*)    All other components within normal limits  URINALYSIS, ROUTINE W REFLEX MICROSCOPIC    EKG None  Radiology CT Renal Stone Study  Result Date: 11/11/2022 CLINICAL DATA:  Left flank  pain. EXAM: CT ABDOMEN AND PELVIS WITHOUT CONTRAST TECHNIQUE: Multidetector CT imaging of the abdomen and pelvis was performed following the standard protocol without IV contrast. RADIATION DOSE REDUCTION: This exam was performed according to the departmental dose-optimization program which includes automated exposure control, adjustment of the mA and/or kV according to patient size and/or use of iterative reconstruction technique. COMPARISON:  August 21, 2022 FINDINGS: Lower chest: No acute abnormality. Hepatobiliary: There is diffuse fatty infiltration of the liver parenchyma. A stable, ill-defined, 4.5 cm x 3.5 cm area low attenuation, and central hyper density, is seen within the right lobe of the liver (axial CT images 11  through 20, CT series 3). No gallstones, gallbladder wall thickening, or biliary dilatation. Pancreas: Unremarkable. No pancreatic ductal dilatation or surrounding inflammatory changes. Spleen: Normal in size without focal abnormality. Adrenals/Urinary Tract: Adrenal glands are unremarkable. Kidneys are normal in size, without focal lesions. A 3 mm nonobstructing renal calculus is seen within the posterior aspect of the mid to lower right kidney. A 1 mm to 2 mm nonobstructing renal calculus is seen within the mid left kidney. A 5 mm obstructing renal calculus is seen within the distal left ureter, with mild to moderate severity left-sided hydronephrosis, hydroureter and perinephric inflammatory fat stranding. The urinary bladder is poorly distended and subsequently limited in evaluation. Stomach/Bowel: Stomach is within normal limits. Appendix appears normal. No evidence of bowel wall thickening, distention, or inflammatory changes. Vascular/Lymphatic: No significant vascular findings are present. No enlarged abdominal or pelvic lymph nodes. Reproductive: Prostate is unremarkable. Other: No abdominal wall hernia or abnormality. No abdominopelvic ascites. Musculoskeletal: No acute or significant osseous findings. IMPRESSION: 1. 5 mm obstructing renal calculus within the distal left ureter. 2. Bilateral subcentimeter nonobstructing renal calculi. 3. Hepatic steatosis. 4. Findings which may represent a hepatic hemangioma. Further evaluation with MRI is recommended to exclude the presence of an underlying neoplastic process. Electronically Signed   By: Aram Candela M.D.   On: 11/11/2022 22:59    Procedures Procedures    Medications Ordered in ED Medications  oxyCODONE-acetaminophen (PERCOCET/ROXICET) 5-325 MG per tablet 1 tablet (1 tablet Oral Given 11/11/22 2222)  magnesium sulfate IVPB 2 g 50 mL (0 g Intravenous Stopped 11/12/22 0858)  ketorolac (TORADOL) 15 MG/ML injection 15 mg (15 mg Intravenous  Given 11/12/22 0752)  ondansetron (ZOFRAN) injection 4 mg (4 mg Intravenous Given 11/12/22 3500)    ED Course/ Medical Decision Making/ A&P                           Medical Decision Making Risk Prescription drug management.   BP 131/81 (BP Location: Right Arm)   Pulse 71   Temp 98.4 F (36.9 C) (Oral)   Resp 16   Ht 5\' 10"  (1.778 m)   Wt 117.5 kg   SpO2 100%   BMI 37.16 kg/m   86:20 AM 36 year old male significant history of kidney stones presenting with flank pain.  Patient reported acute onset of left flank pain that started last night.  Pain is sharp stabbing nonradiating persistent 10 out of 10 nothing seems to make it better or worse.  He felt nauseous and did vomit once.  Does not endorse any fever or chills no chest pain or shortness of breath no productive cough no trouble urinating no hematuria no focal numbness or focal weakness.  No complaints of penile or testicle pain.  Patient did receive Percocet while waiting in the waiting room with some improvement.  Patient mention he had a kidney stone approximately 2 years ago requiring lithotripsy.  On exam, patient is resting comfortably in bed appears to be in no acute discomfort.  Heart lung sounds normal, abdomen is soft and nontender without any abdominal bruit.  He does have left CVA tenderness.  -Labs ordered, independently viewed and interpreted by me.  Labs remarkable for normal UA, minimially elevated lipase of 57 which is nonspecific.  Mildly elevated transaminance of AST 50, ALT 77.  -The patient was maintained on a cardiac monitor.  I personally viewed and interpreted the cardiac monitored which showed an underlying rhythm of: NSR -Imaging independently viewed and interpreted by me and I agree with radiologist's interpretation.  Result remarkable for CT abd/pelvis showing 55mm obstructing renal calculus in the distal left ureter.  This is likely the source of pt's discomfort.  Incidentally a lesion in the liver likely  hepatic hemangioma were noted.  I discussed this with pt and recommend f/u MRI outpt to exclude the presence of an underlying tumor.  I also documented it in his discharge paper.   -This patient presents to the ED for concern of L flank pain, this involves an extensive number of treatment options, and is a complaint that carries with it a high risk of complications and morbidity.  The differential diagnosis includes kidney stone, MSK pain, aortic dissection, shingle -Co morbidities that complicate the patient evaluation includes hx of kidney stone -Treatment includes toradol, percocet, zofran, mag -Reevaluation of the patient after these medicines showed that the patient improved -PCP office notes or outside notes reviewed -Escalation to admission/observation considered: patients feels much better, is comfortable with discharge, and will follow up with PCP -Prescription medication considered, patient comfortable with percocet, flomax and zofran -Social Determinant of Health considered   Improvement of symptoms after receiving pain medication.  Patient is stable to be discharged home with supportive care and outpatient follow-up with urology.  Return precaution given.        Final Clinical Impression(s) / ED Diagnoses Final diagnoses:  Kidney stone on left side    Rx / DC Orders ED Discharge Orders          Ordered    oxyCODONE-acetaminophen (PERCOCET/ROXICET) 5-325 MG tablet  Every 6 hours PRN        11/12/22 0857    tamsulosin (FLOMAX) 0.4 MG CAPS capsule  Daily        11/12/22 0857    ondansetron (ZOFRAN) 4 MG tablet  Every 6 hours        11/12/22 0857              Fayrene Helper, PA-C 11/12/22 0900    Gloris Manchester, MD 11/12/22 [phone]
